# Patient Record
Sex: Male | Born: 1950 | Race: White | Hispanic: No | Marital: Married | Smoking: Former smoker
Health system: Southern US, Community
[De-identification: ages and names within clinical notes are randomized; demographics above are authoritative.]

## PROBLEM LIST (undated history)

## (undated) DIAGNOSIS — I10 Essential (primary) hypertension: Secondary | ICD-10-CM

## (undated) DIAGNOSIS — I723 Aneurysm of iliac artery: Secondary | ICD-10-CM

## (undated) DIAGNOSIS — M1990 Unspecified osteoarthritis, unspecified site: Secondary | ICD-10-CM

## (undated) DIAGNOSIS — I509 Heart failure, unspecified: Secondary | ICD-10-CM

## (undated) DIAGNOSIS — I2510 Atherosclerotic heart disease of native coronary artery without angina pectoris: Secondary | ICD-10-CM

## (undated) DIAGNOSIS — Z95810 Presence of automatic (implantable) cardiac defibrillator: Secondary | ICD-10-CM

## (undated) DIAGNOSIS — E785 Hyperlipidemia, unspecified: Secondary | ICD-10-CM

## (undated) DIAGNOSIS — E669 Obesity, unspecified: Secondary | ICD-10-CM

## (undated) DIAGNOSIS — I219 Acute myocardial infarction, unspecified: Secondary | ICD-10-CM

## (undated) DIAGNOSIS — M869 Osteomyelitis, unspecified: Secondary | ICD-10-CM

## (undated) DIAGNOSIS — I724 Aneurysm of artery of lower extremity: Secondary | ICD-10-CM

## (undated) HISTORY — DX: Aneurysm of artery of lower extremity: I72.4

## (undated) HISTORY — PX: TONSILLECTOMY: SUR1361

## (undated) HISTORY — DX: Hyperlipidemia, unspecified: E78.5

## (undated) HISTORY — PX: ANKLE FUSION: SHX881

## (undated) HISTORY — DX: Essential (primary) hypertension: I10

## (undated) HISTORY — DX: Osteomyelitis, unspecified: M86.9

## (undated) HISTORY — PX: FRACTURE SURGERY: SHX138

## (undated) HISTORY — DX: Obesity, unspecified: E66.9

## (undated) HISTORY — PX: ANKLE FRACTURE SURGERY: SHX122

## (undated) HISTORY — DX: Atherosclerotic heart disease of native coronary artery without angina pectoris: I25.10

## (undated) HISTORY — DX: Aneurysm of iliac artery: I72.3

---

## 1971-04-01 HISTORY — PX: FEMUR FRACTURE SURGERY: SHX633

## 1971-04-01 HISTORY — DX: Rider (driver) (passenger) of other motorcycle injured in unspecified traffic accident, initial encounter: V29.99XA

## 2004-11-25 ENCOUNTER — Encounter (INDEPENDENT_AMBULATORY_CARE_PROVIDER_SITE_OTHER): Payer: Self-pay | Admitting: *Deleted

## 2004-11-25 ENCOUNTER — Ambulatory Visit (HOSPITAL_BASED_OUTPATIENT_CLINIC_OR_DEPARTMENT_OTHER): Admission: RE | Admit: 2004-11-25 | Discharge: 2004-11-25 | Payer: Self-pay | Admitting: Otolaryngology

## 2004-11-25 ENCOUNTER — Ambulatory Visit (HOSPITAL_COMMUNITY): Admission: RE | Admit: 2004-11-25 | Discharge: 2004-11-25 | Payer: Self-pay | Admitting: Otolaryngology

## 2008-01-18 HISTORY — PX: CARDIAC CATHETERIZATION: SHX172

## 2008-01-21 ENCOUNTER — Ambulatory Visit: Payer: Self-pay | Admitting: Cardiothoracic Surgery

## 2008-02-28 ENCOUNTER — Ambulatory Visit: Payer: Self-pay | Admitting: Vascular Surgery

## 2008-02-28 ENCOUNTER — Encounter: Payer: Self-pay | Admitting: Cardiothoracic Surgery

## 2008-02-28 ENCOUNTER — Ambulatory Visit (HOSPITAL_COMMUNITY): Admission: RE | Admit: 2008-02-28 | Discharge: 2008-02-28 | Payer: Self-pay | Admitting: Cardiothoracic Surgery

## 2008-03-01 ENCOUNTER — Ambulatory Visit: Payer: Self-pay | Admitting: Cardiothoracic Surgery

## 2008-03-01 ENCOUNTER — Inpatient Hospital Stay (HOSPITAL_COMMUNITY): Admission: RE | Admit: 2008-03-01 | Discharge: 2008-03-05 | Payer: Self-pay | Admitting: Cardiothoracic Surgery

## 2008-03-01 HISTORY — PX: CORONARY ARTERY BYPASS GRAFT: SHX141

## 2008-03-20 ENCOUNTER — Encounter: Admission: RE | Admit: 2008-03-20 | Discharge: 2008-03-20 | Payer: Self-pay | Admitting: Cardiothoracic Surgery

## 2008-03-20 ENCOUNTER — Ambulatory Visit: Payer: Self-pay | Admitting: Cardiothoracic Surgery

## 2008-03-21 ENCOUNTER — Encounter (HOSPITAL_COMMUNITY): Admission: RE | Admit: 2008-03-21 | Discharge: 2008-03-29 | Payer: Self-pay | Admitting: Cardiology

## 2008-03-31 ENCOUNTER — Encounter (HOSPITAL_COMMUNITY): Admission: RE | Admit: 2008-03-31 | Discharge: 2008-05-29 | Payer: Self-pay | Admitting: Cardiology

## 2008-10-04 HISTORY — PX: OTHER SURGICAL HISTORY: SHX169

## 2009-07-13 ENCOUNTER — Encounter: Admission: RE | Admit: 2009-07-13 | Discharge: 2009-07-13 | Payer: Self-pay | Admitting: Orthopedic Surgery

## 2009-08-29 ENCOUNTER — Encounter: Admission: RE | Admit: 2009-08-29 | Discharge: 2009-08-29 | Payer: Self-pay | Admitting: Orthopedic Surgery

## 2009-11-23 IMAGING — CR DG CHEST 2V
2 series · 2 of 2 positions shown · non-contrast
Comparison: None

CLINICAL DATA: Chest pain and short of breath.

CHEST - 2 VIEW

[view not recorded (1 of 2)]
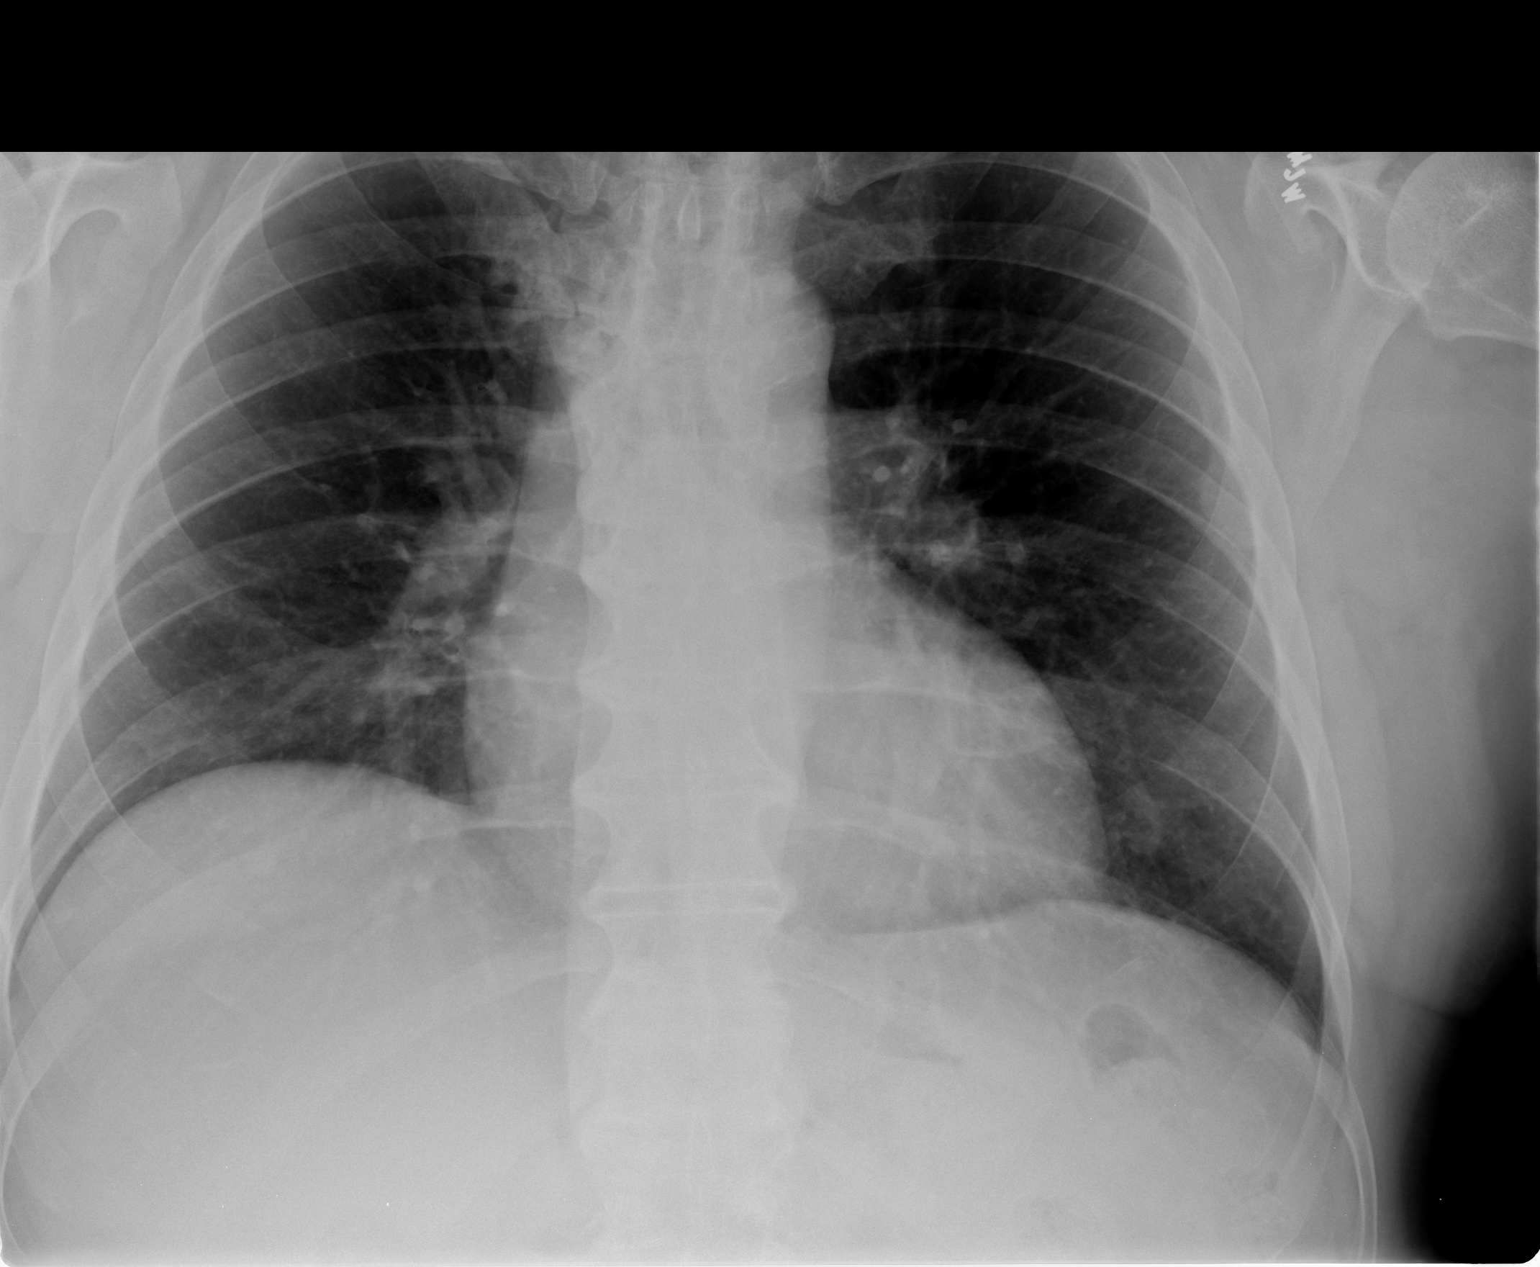

[view not recorded (2 of 2)]
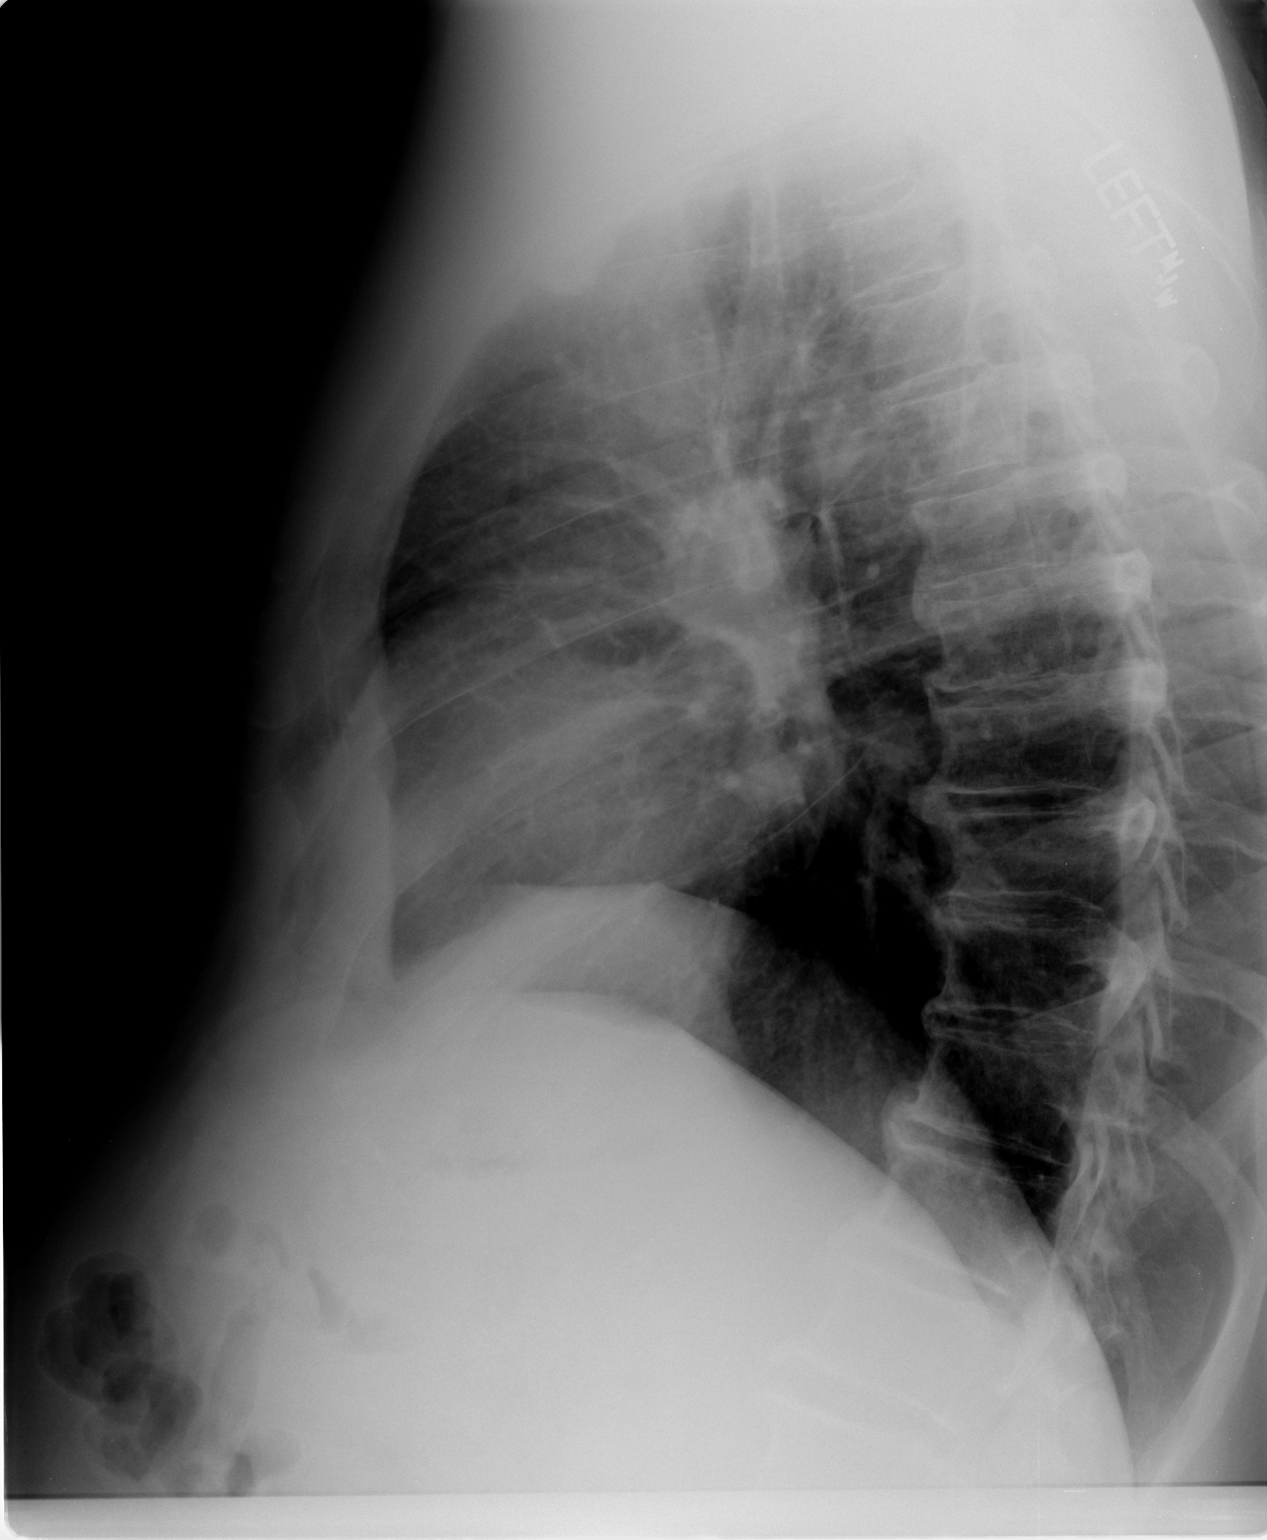

[2 of 2 positions shown; findings below may reference images not displayed]

FINDINGS: The heart is upper normal in size and there is no heart
failure.  The lungs are clear without infiltrate or effusion.
Thoracic disc degeneration and osteophytes are noted.
IMPRESSION: No active cardiopulmonary disease.

## 2009-11-25 IMAGING — CR DG CHEST 1V PORT
1 series · 1 of 1 positions shown · non-contrast
Comparison: 02/28/2008

CLINICAL DATA: CABG procedure.

PORTABLE CHEST - 1 VIEW

[AP]
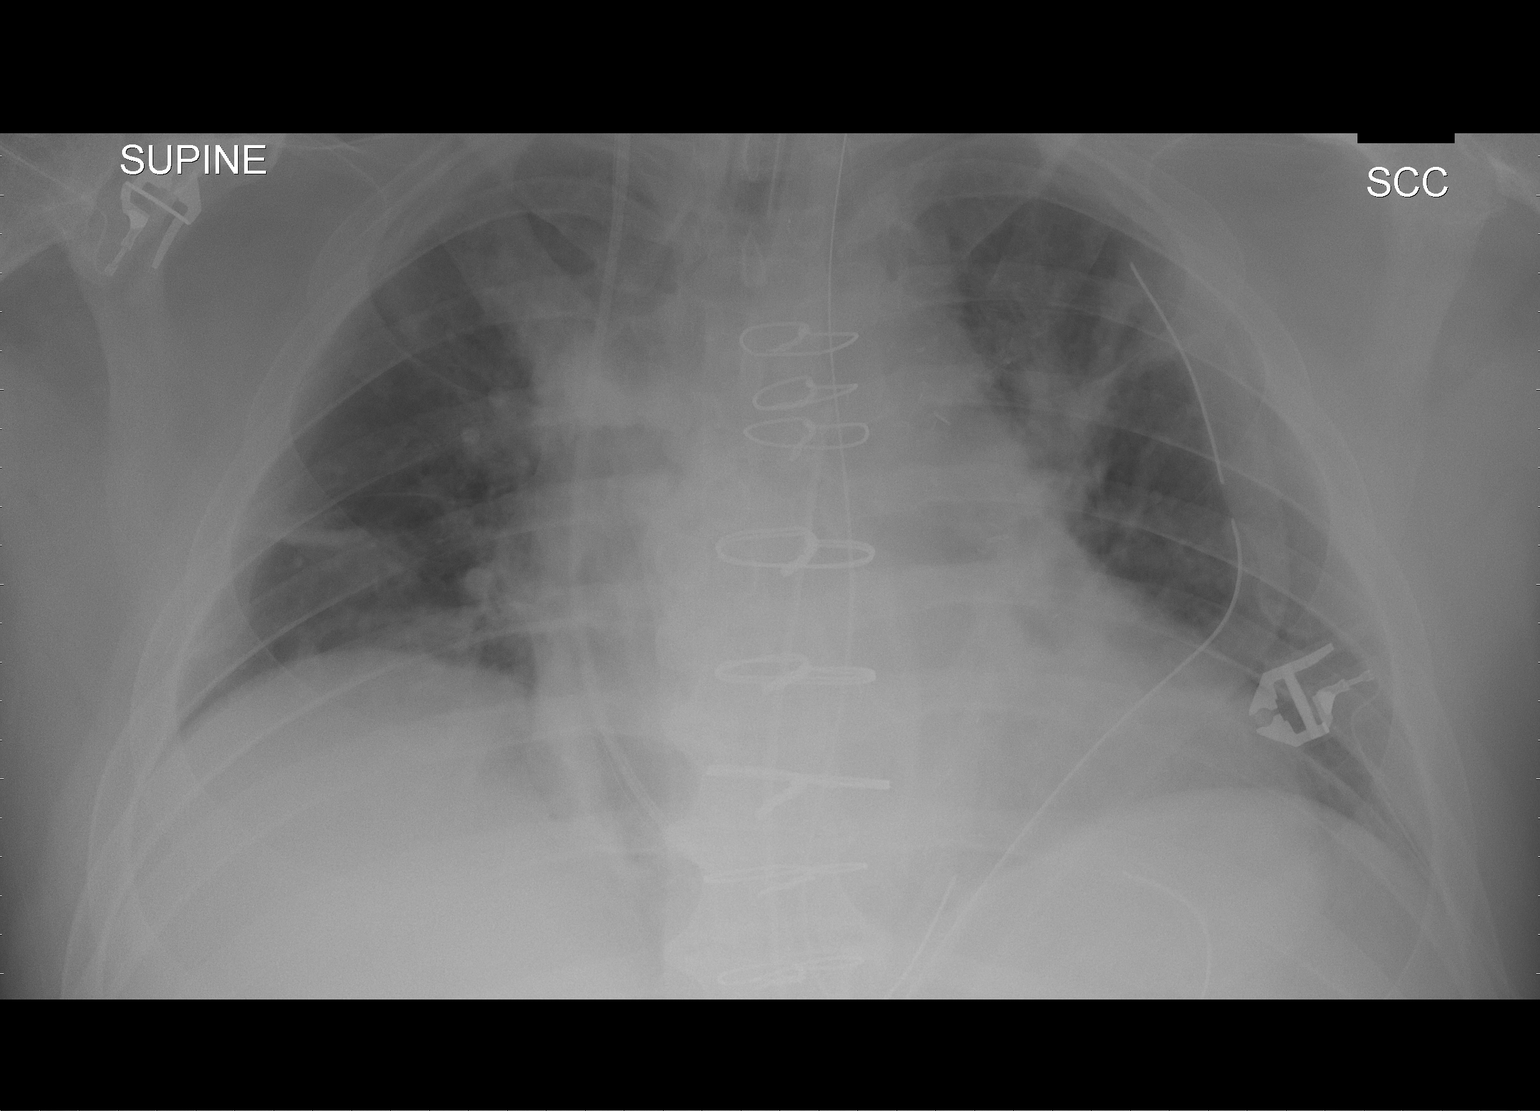

[1 of 1 positions shown; findings below may reference images not displayed]

FINDINGS: Portable view of the chest demonstrates an endotracheal
tube that is 5.6 cm above the carina. There is a nasogastric tube
along with left chest and mediastinal drains.  The patient has low
lung volumes with vascular crowding and atelectasis.  The tip of
the pulmonary arterial catheter is difficult to visualize.  No
evidence for a large pneumothorax.
IMPRESSION: Expected postoperative changes with low lung volumes.  Negative for
a large pneumothorax.

Pulmonary artery catheter tip is difficult to visualize.

## 2009-11-26 IMAGING — CR DG CHEST 1V PORT
1 series · 1 of 1 positions shown · non-contrast
Comparison: Yesterday's exam

CLINICAL DATA: CABG.  Follow-up exam.

PORTABLE CHEST - 1 VIEW

[view not recorded]
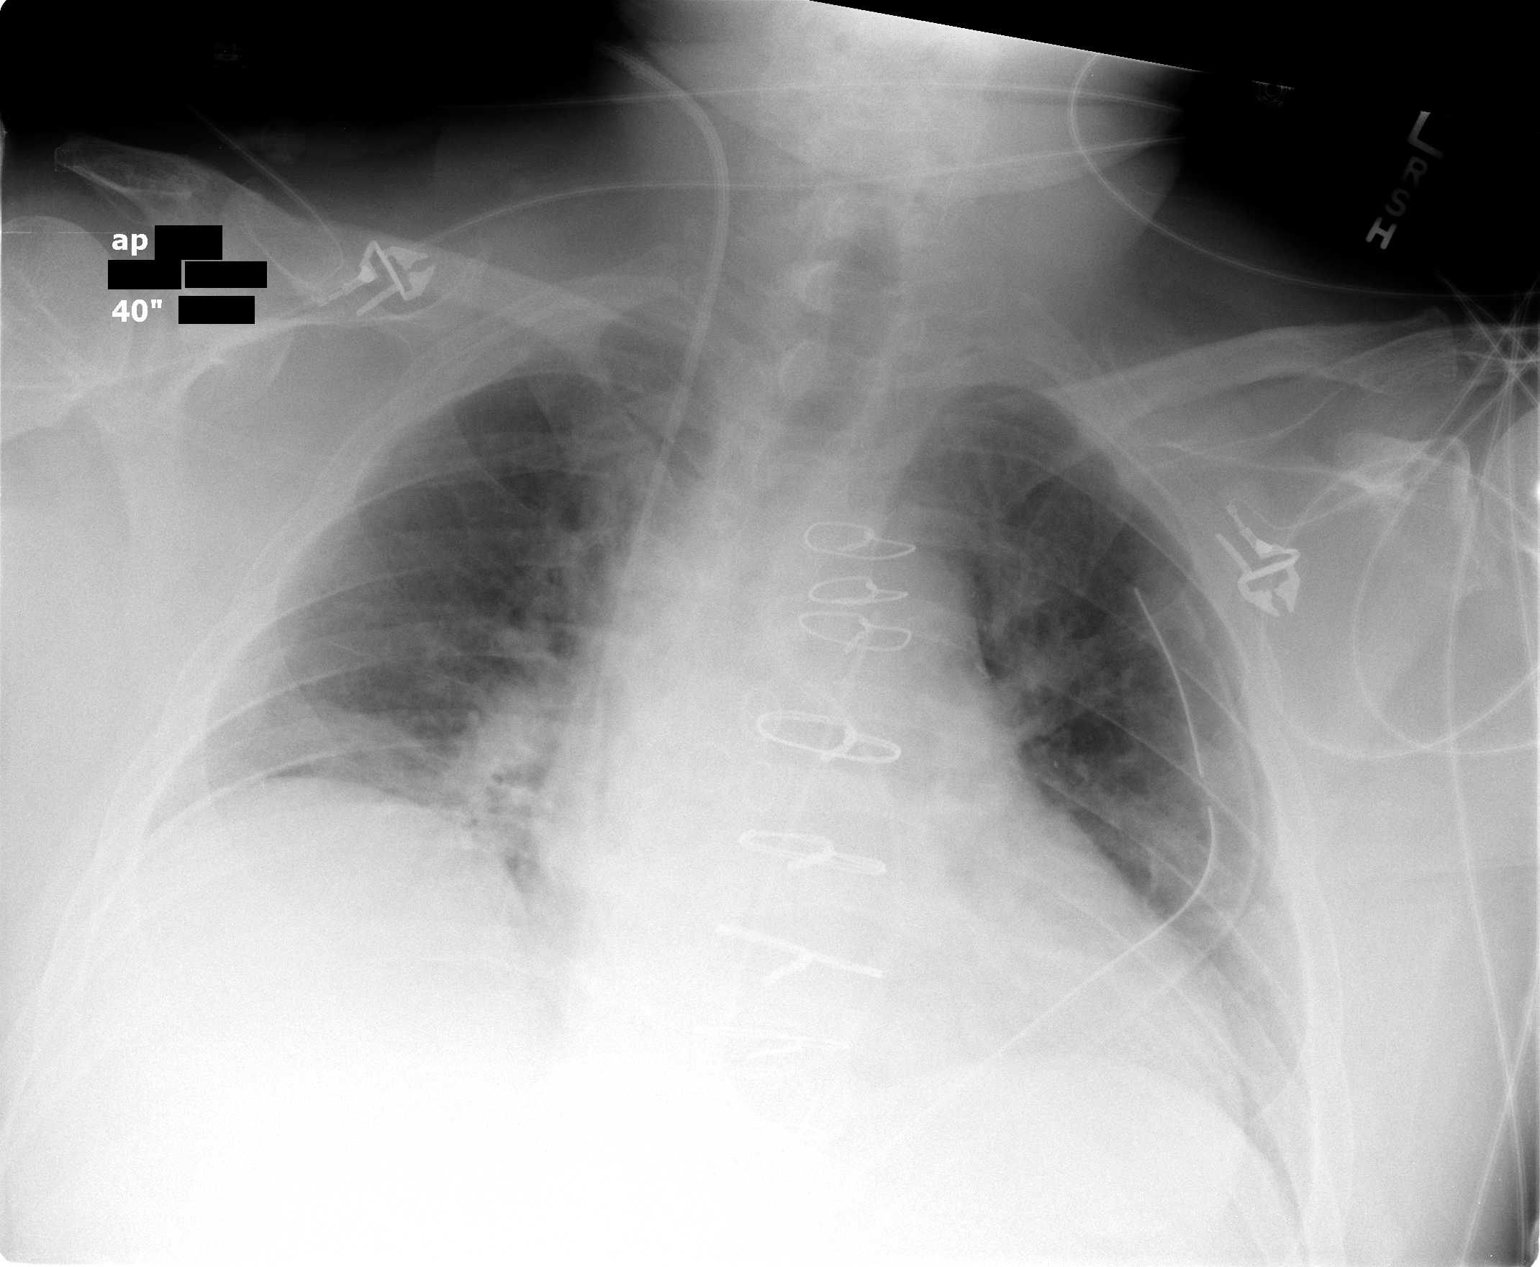

[1 of 1 positions shown; findings below may reference images not displayed]

FINDINGS: The ET and NG tubes have been removed.  The SGC is in the
main pulmonary artery segment region.  Mediastinal and left pleural
chest tubes in position.  No pneumothorax.  Subsegmental
atelectasis at the right base.  Decrease in subsegmental
atelectasis on the left.
IMPRESSION: Satisfactory post CABG chest radiographs.  Subsegmental atelectasis
at the right base.

## 2009-11-28 IMAGING — CR DG CHEST 2V
2 series · 2 of 2 positions shown · non-contrast
Comparison: 03/03/2008.

CLINICAL DATA: The post CABG.

CHEST - 2 VIEW

[w chest pa]
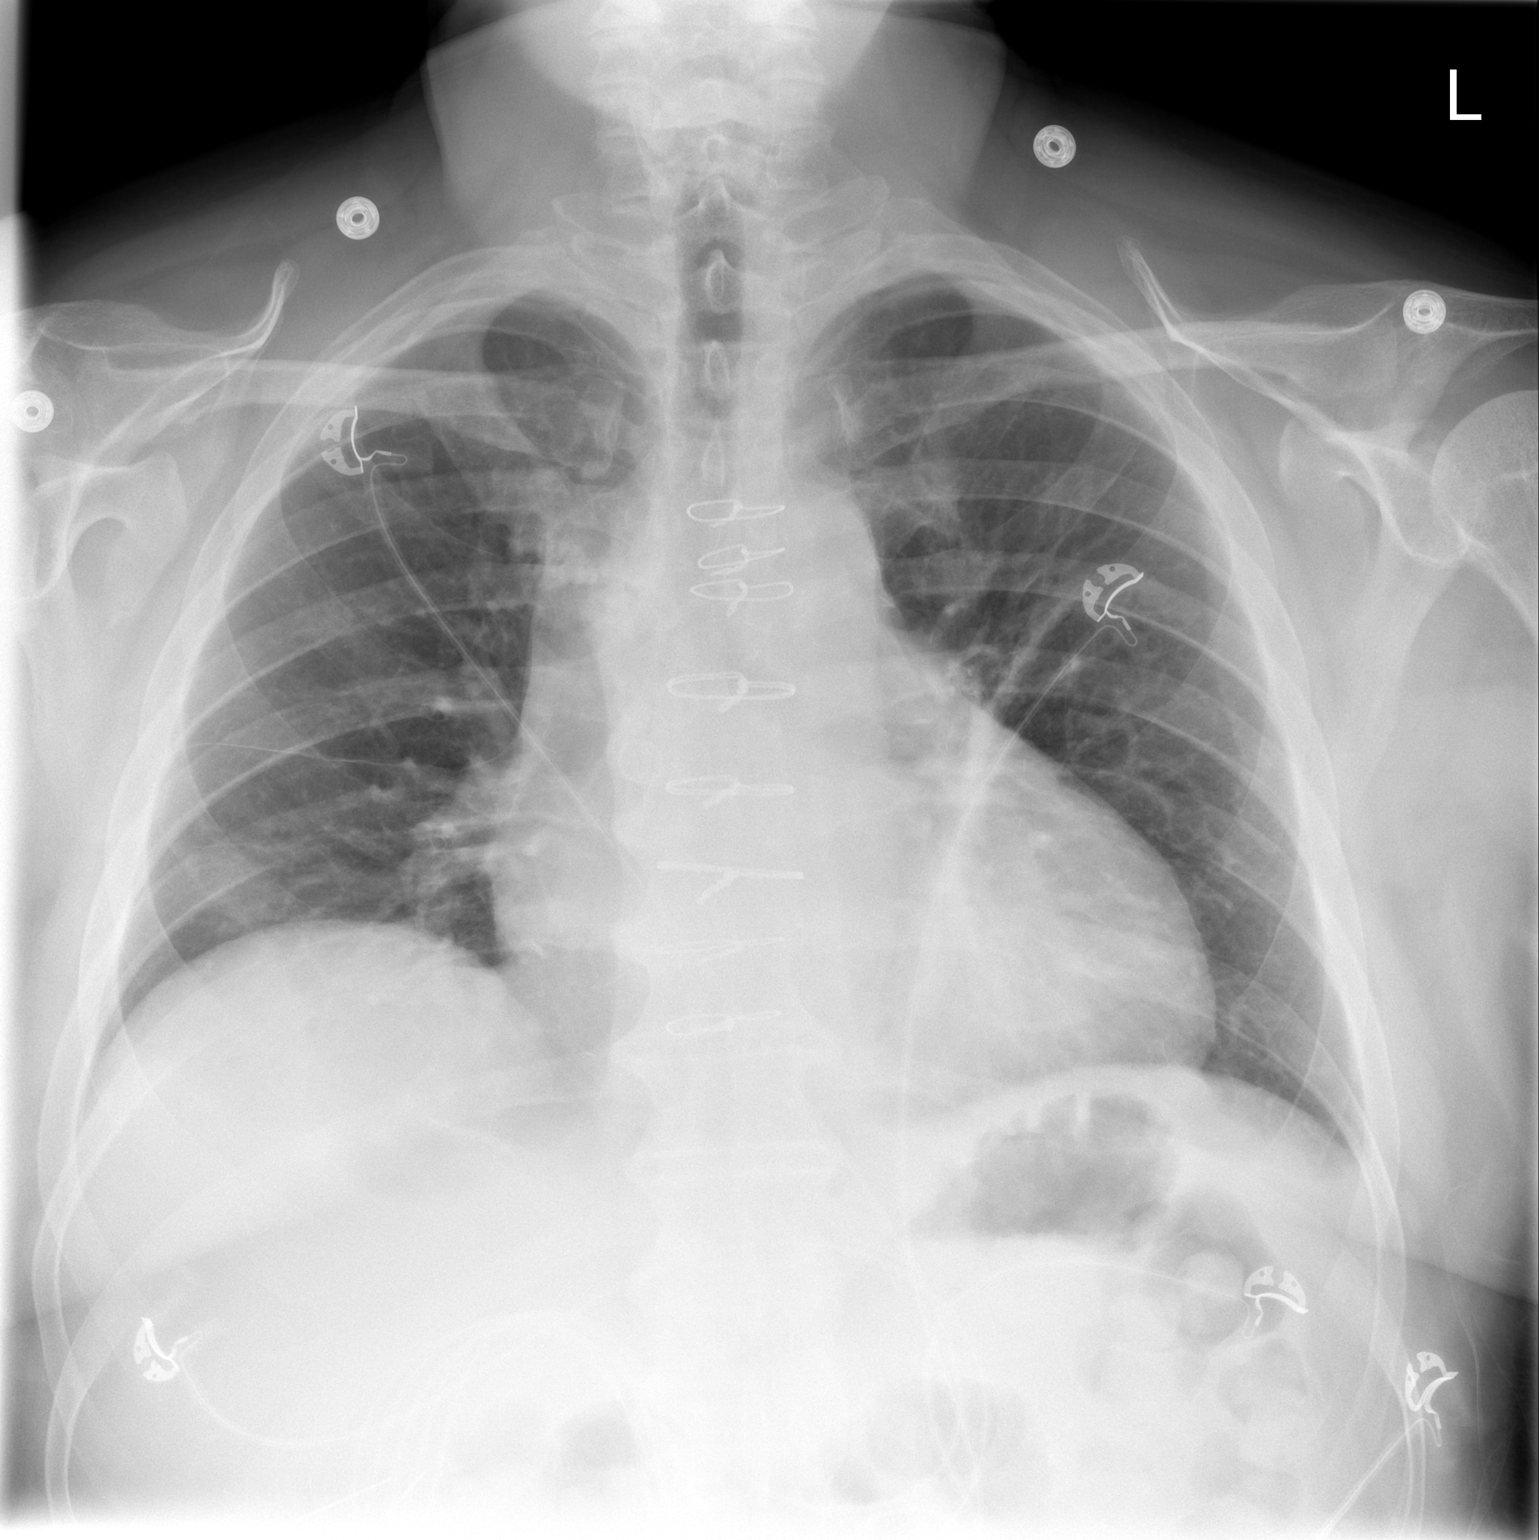

[w chest lat]
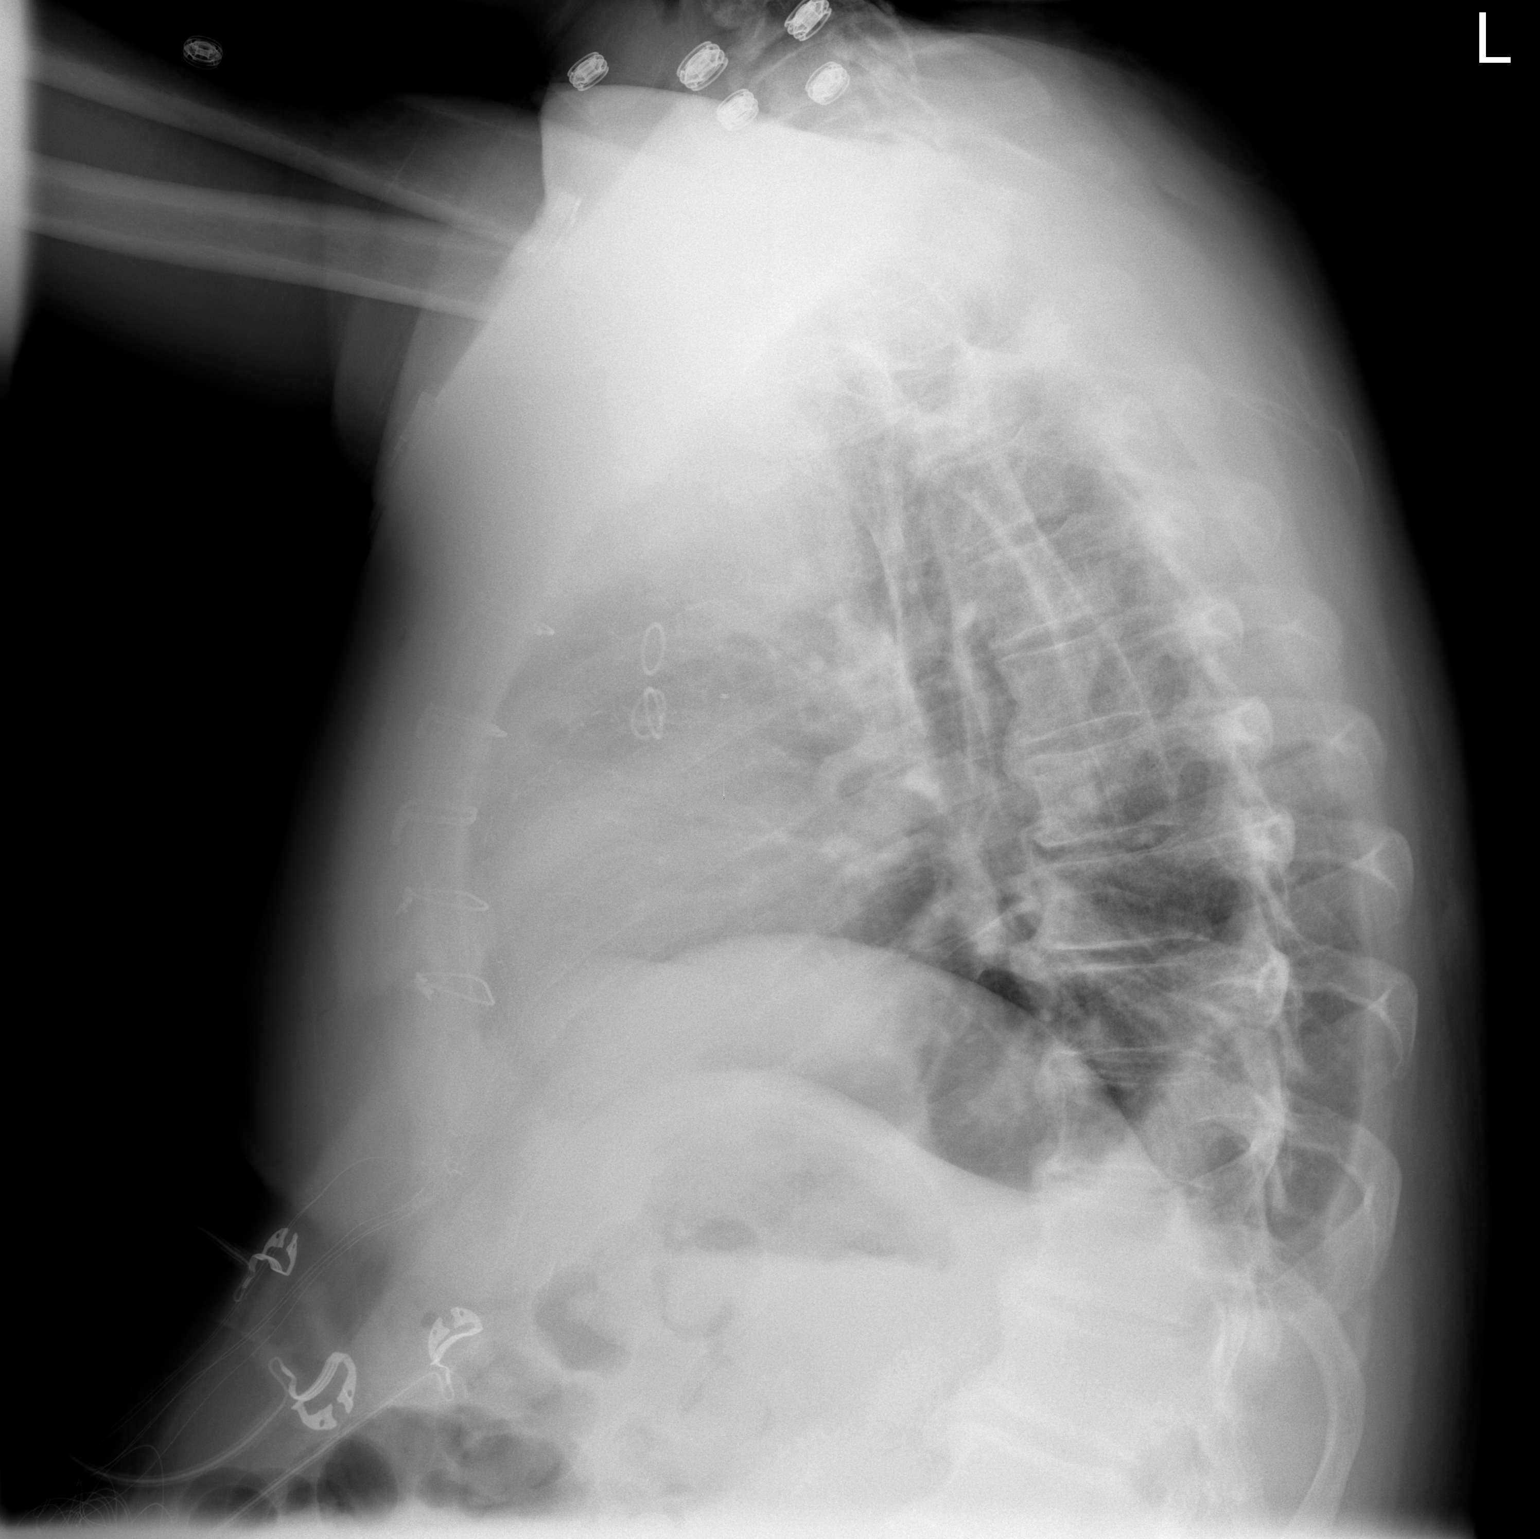

[2 of 2 positions shown; findings below may reference images not displayed]

FINDINGS: Lung volumes are low. The cardiopericardial silhouette is
enlarged. Persistent subsegmental atelectasis or scarring in the
left perihilar region.  Small bilateral pleural effusions are
noted.  The patient is status post CABG. Right IJ sheath seen
previously has been removed in the interval.  Telemetry leads
overlie the chest.
IMPRESSION: Low lung volumes with cardiomegaly and small bilateral pleural
effusions.

## 2010-02-28 HISTORY — PX: OTHER SURGICAL HISTORY: SHX169
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
## 2010-08-13 NOTE — Assessment & Plan Note (Signed)
OFFICE VISIT   LAINE, GIOVANETTI [facility]  DOB:  December 25, 1950                                        March 20, 2008  CHART #:  [id number]   HISTORY:  The patient is a 60 year old gentleman, now seen in office  visit following his coronary artery bypass grafting x4 done on  03/01/2008, per Dr. Kathlee Nations Trigt.  This was done for class IV  progressive angina with severe 3-vessel coronary artery disease and redo  of his left ventricular function.  His preoperative echocardiogram  showed a 30% ejection fraction with anterior apical and inferior wall  scarring.  He did well postsurgery and he is seen on today's date for  routine office followup.  Currently, he reports that overall he is doing  well.  He has been going to the gym for short walks on the treadmill as  well as the stationary bike.  He is not lifting any heavy weights or  exerting himself too much.  He does plan to participate in the phase 2  cardiac rehabilitation program.  He does have occasional sternal  discomfort.  He denies significant shortness of breath.  He denies  fevers, chills, or other constitutional symptoms.   CHEST X-RAY:  Chest x-ray was obtained on today's date.  It reveals tiny  left pleural effusion, stable mild cardiomegaly, and no other  acute  findings.   PHYSICAL EXAMINATION:  VITAL SIGNS:  Blood pressure 103/66, pulse 67,  respirations 18, and oxygen saturation is 96% on room air.  GENERAL:  A well-developed, adult white male in no acute distress.  PULMONARY:  Clear lungs throughout.  CARDIAC:  Regular rate and rhythm.  No murmurs, gallops, or rubs.  ABDOMEN:  Soft and nontender.  Incision is healing well without evidence  of infection.  EXTREMITIES:  No edema.   ASSESSMENT:  The patient is making excellent ongoing progress following  his surgical revascularization.  He is encouraged to continue his  rehabilitation with the formal cardiac rehabilitation phase 2 program.  I  have instructed him to continue to limit his lifting for the next  2 months.  He can resume driving at this time.  He is to continue to  follow up with his  cardiologist as they recommend for ongoing cardiac management.  We will  see him on a p.r.n. basis for any further surgical issues as they  present or at any time as requested.   [name], P.A.-C.   Sherryll Burger  D:  03/20/2008  T:  03/20/2008  Job:  [id number]

## 2010-08-13 NOTE — Op Note (Signed)
NAME:  Darrell Farrell, Darrell Farrell NO.:  [id number]   MEDICAL RECORD NO.:  [id number]          PATIENT TYPE:  INP   LOCATION:  2315                         FACILITY:  [facility]   PHYSICIAN:  Kerin Perna, M.D.  DATE OF BIRTH:  1950/11/15   DATE OF PROCEDURE:  03/01/2008  DATE OF DISCHARGE:                               OPERATIVE REPORT   OPERATIONS:  1. Coronary artery bypass grafting x4 (left internal mammary artery to      left anterior descending, saphenous vein graft to diagonal,      saphenous vein graft to obtuse marginal, and saphenous vein graft      to posterior descending).  2. Endoscopic harvest of left leg greater saphenous vein.   PREOPERATIVE DIAGNOSIS:  Class IV progressive angina with severe three-  vessel coronary artery disease and reduced left ventricular function.   POSTOPERATIVE DIAGNOSIS:  Class IV progressive angina with severe three-  vessel coronary artery disease and reduced left ventricular function.   SURGEON:  Kerin Perna, MD   ASSISTANTS:  1. Sheliah Plane, MD  2. Doree Fudge, PA-C   ANESTHESIA:  General.   INDICATIONS:  The patient is a 60 year old white male who has been  evaluated for progressive dyspnea on exertion and chest discomfort.  Cardiac catheterization by Dr. Lynnea Ferrier demonstrated severe three-vessel  disease with chronic occlusion of the LAD, chronic occlusion of the  right coronary, and high-grade stenosis of the circumflex.  His EF was  30% with an anterior apical and an inferior wall scar.  There was no  evidence of valvular insufficiency on a preoperative 2-D echo.  He was  felt to be a candidate for surgical revascularization and I examined the  patient in the office as a preoperative consult.  I discussed the  indications and expected benefits of coronary bypass surgery for  treatment of his coronary artery disease.  I reviewed the alternatives  to surgical therapy.  I discussed with the patient and his  wife the  major issues of surgery including the location of the surgical  incisions, use of general anesthesia, and cardiopulmonary bypass, and  the expected postoperative recovery.  I reviewed with the patient the  risks to him of this operation including risks of MI, stroke, bleeding,  infection, and death.  After reviewing these issues, he demonstrated his  understanding and agreed to proceed with the operation as planned under  what I felt was an informed consent.   OPERATIVE FINDINGS:  1. Old LV apical and inferior wall scars with thinning of the      myocardium.  2. Adequate targets for grafting.  3. Good conduit using the left internal mammary artery and left leg      greater saphenous vein.   PROCEDURE:  The patient was brought to operating room and placed supine  on the operating table where general anesthesia was induced.  The chest,  abdomen, and legs were prepped with Betadine and draped as a sterile  field.  A 2-D echo probe was placed by the anesthesiologist.  A sternal  incision was made and the saphenous  vein was harvested endoscopically  from the left leg.  The left internal mammary artery was harvested as a  pedicle graft from its origin at the subclavian vessels.  It was a good  vessel with excellent flow.  The sternal retractor was placed using the  deep blades due to the patient's obese body habitus.  The pericardium  was opened and suspended.  Pursestrings were placed in the ascending  aorta and right atrium.  After the vein had been harvested and inspected  and found to be adequate, the patient was given full systemic heparin  and was cannulated through pursestrings into the ascending aorta and  right atrium.  The patient was then placed on bypass and the coronaries  were identified for grafting.  The mammary artery and vein grafts were  prepared for the distal anastomoses.  Cardioplegia catheters were placed  both antegrade aortic and retrograde coronary sinus  cardioplegia.  The  patient was cooled to 32 degrees and the aortic cross-clamp was applied.  800 mL of cold blood cardioplegia was delivered in split doses between  the antegrade aortic and retrograde coronary sinus catheters.  There was  good cardioplegic arrest and septal temperature dropped less than 12  degrees.  Cardioplegia was then delivered every 20 minutes or less while  the cross-clamp was applied.   The distal coronary anastomoses were then performed.  The first distal  anastomosis was to the posterior descending branch of right coronary.  It was totally occluded.  A reverse saphenous vein was sewn end-to-side  with running 7-0 Prolene with good flow through the graft.  The second  distal anastomosis was to the obtuse marginal branch of the circumflex.  It had a proximal 80% stenosis.  A reverse saphenous vein was sewn end-  to-side with running 7-0 Prolene with good flow through the graft.  The  third distal anastomosis was to the diagonal branch of LAD.  This was  1.5 mm vessel with proximal total occlusion.  Reverse saphenous vein was  sewn end-to-side with running 7-0 Prolene with good flow through the  graft.  Cardioplegia was redosed.  The fourth distal anastomosis was to  the distal third of the LAD.  This was a 1.5 mm vessel with heavy  plaque.  It was proximally occluded.  The left IMA pedicle was brought  through an opening created in the left lateral pericardium and was  brought down onto the LAD and sewn end-to-side with running 8-0 Prolene.  There was good flow through the anastomosis after briefly releasing the  pedicle bulldog on the mammary artery.  The bulldog was reapplied and  the pedicle was secured to the epicardium.   While the cross-clamp was still in place, 3 proximal vein anastomoses  were performed on the ascending aorta using a 4.0 mm punch with running  7-0 Prolene.  Prior to tying down the final proximal anastomosis, the  air was vented from the  coronaries with a dose of retrograde warm blood  cardioplegia.  The final proximal anastomosis was tied and the cross-  clamp was removed.   The heart resumed a spontaneous rhythm.  Air was aspirated from the vein  grafts with a 27-gauge needle and each graft was opened and had good  flow.  The cardioplegia catheters were removed.  Proximal and distal  anastomoses were checked and found to be hemostatic.  The patient was  rewarmed to 37 degrees.  Temporary pacing wires were applied.  Due to  the  patient's poor LV function, a left ventricular epicardial lead was  placed for biventricular pacing.  The lungs were then re-expanded and  the ventilator was resumed.  The patient was weaned from bypass on low-  dose dopamine.  Cardiac output and blood pressure were stable.  The  transesophageal echo showed improved global LV function.  Protamine was  administered without adverse reaction.  The cannulas were removed.  The  mediastinum was irrigated with warm antibiotic irrigation.  Leg incision  was irrigated and closed in a standard fashion.  The superior  pericardial fat was closed over the aorta.  Two mediastinal and a left  pleural chest tube were placed and  brought out through separate incisions.  The sternum was closed with  interrupted steel wire.  The pectoralis fascia was closed with a running  #1 Vicryl.  Subcutaneous and skin layers were closed with running Vicryl  and sterile dressings were applied.  Total bypass time was 124 minutes.      Kerin Perna, M.D.  Electronically Signed     PV/MEDQ  D:  03/01/2008  T:  03/02/2008  Job:  [id number]   cc:   Ritta Slot, MD
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
## 2010-08-13 NOTE — Op Note (Signed)
NAME:  Darrell Farrell, Darrell Farrell NO.:  [id number]   MEDICAL RECORD NO.:  [id number]          PATIENT TYPE:  INP   LOCATION:  2315                         FACILITY:  [facility]   PHYSICIAN:  Guadalupe Maple, M.D.  DATE OF BIRTH:  1950/10/31   DATE OF PROCEDURE:  03/01/2008  DATE OF DISCHARGE:                               OPERATIVE REPORT   PROCEDURE:  Intraoperative transesophageal echocardiography.   [name] is a 60 year old white male with a history of coronary  artery disease and left ventricular dysfunction who is scheduled to  undergo coronary artery bypass grafting by Dr. Morton Peters.  Intraoperative transesophageal echocardiography was requested to  evaluate the left ventricular function and to determine if any valvular  pathology was present and serve as a monitor for intraoperative volume  status.   The patient was brought to the operating room at [facility] and  general anesthesia was induced without difficulty.  The trachea was  intubated without difficulty.  The transesophageal echocardiography  probe was inserted into the esophagus without difficulty.   IMPRESSION:   PREBYPASS FINDINGS:  1. Aortic valve.  The aortic valve was trileaflet.  The leaflets were      thin and pliable and opened normally.  There was no aortic      insufficiency.  2. Mitral valve.  The mitral leaflets were thin and pliable, opened      normally and there was trace mitral insufficiency.  There were no      prolapse or fluttering of the leaflets.  3. Left ventricle.  There was moderate left ventricular dysfunction.      The distal anterior septum, anterior wall, and apex was akinetic      and appeared thinned, the basilar inferior wall was hypokinetic to      akinetic.  The other segments appeared to be contracting normally.      Ejection fraction was estimated at 30%.  There was no thrombus in      the left ventricular apex.  4. Right ventricle.  The right ventricular  size was normal.  There was      good contractility of the right ventricular free wall.  5. Tricuspid valve.  The tricuspid valve was structurally intact with      trace tricuspid insufficiency.  6. Interatrial septum.  The interatrial septum was intact without      evidence of patent foramen ovale or atrial septal defect.  7. Left atrium.  The left atrial size appeared to be within normal      limits.  There was no thrombus in the left atrium and left atrial      appendage.  8. Ascending aorta.  The ascending aorta appeared normal.  There was      no significant atheromatous disease noted.  There was a well-      defined aortic root in sinotubular region, no aneurysmal areas      appreciated.  9. Descending aorta.  The descending aorta showed slight atheromatous      disease and measured 2.7 cm in diameter.   POSTBYPASS FINDINGS:  1. The aortic valve.  The aortic valve was unchanged from the      prebypass study.  The leaflets opened normally and there was no      aortic insufficiency.  2. Mitral valve.  The mitral leaflets opened normally.  There was      trace mitral insufficiency.  3. Left ventricle.  The left ventricular function again showed      ejection fraction approximately at 30% with akinetic distal      anterior wall and anterior septum and apex with severely      hypokinetic to akinetic basilar inferior wall.  Other segments      appeared to contract normally and ejection fraction was again      estimated at 30-35%.           ______________________________  Guadalupe Maple, M.D.     DCJ/MEDQ  D:  03/01/2008  T:  03/02/2008  Job:  [id number]

## 2010-12-31 LAB — COMPREHENSIVE METABOLIC PANEL
ALT: 47 U/L (ref 0–53)
AST: 32 U/L (ref 0–37)
Albumin: 4 g/dL (ref 3.5–5.2)
Alkaline Phosphatase: 97 U/L (ref 39–117)
BUN: 8 mg/dL (ref 6–23)
CO2: 22 mEq/L (ref 19–32)
Calcium: 9.4 mg/dL (ref 8.4–10.5)
Chloride: 106 mEq/L (ref 96–112)
Creatinine, Ser: 0.66 mg/dL (ref 0.4–1.5)
GFR calc Af Amer: >60 mL/min (ref >60)
GFR calc non Af Amer: >60 mL/min (ref >60)
Glucose, Bld: 109 mg/dL — ABNORMAL HIGH (ref 70–99)
Potassium: 4.4 mEq/L (ref 3.5–5.1)
Sodium: 138 mEq/L (ref 135–145)
Total Bilirubin: 0.8 mg/dL (ref 0.3–1.2)
Total Protein: 6.3 g/dL (ref 6.0–8.3)

## 2010-12-31 LAB — CBC
HCT: 45.8 % (ref 39.0–52.0)
Hemoglobin: 16.4 g/dL (ref 13.0–17.0)
MCHC: 35.8 g/dL (ref 30.0–36.0)
MCV: 91.5 fL (ref 78.0–100.0)
Platelets: 189 10*3/uL (ref 150–400)
RBC: 5 MIL/uL (ref 4.22–5.81)
RDW: 12.8 % (ref 11.5–15.5)
WBC: 6.1 10*3/uL (ref 4.0–10.5)

## 2010-12-31 LAB — BLOOD GAS, ARTERIAL
Acid-base deficit: 0.1 mmol/L (ref 0.0–2.0)
Bicarbonate: 23.8 mEq/L (ref 20.0–24.0)
Drawn by: 181601
FIO2: 0.21 %
O2 Saturation: 97.9 %
Patient temperature: 98.6
TCO2: 24.9 mmol/L (ref 0–100)
pCO2 arterial: 37.1 mmHg (ref 35.0–45.0)
pH, Arterial: 7.422 (ref 7.350–7.450)
pO2, Arterial: 97.3 mmHg (ref 80.0–100.0)

## 2010-12-31 LAB — HEMOGLOBIN A1C
Hgb A1c MFr Bld: 5.5 % (ref 4.6–6.1)
Mean Plasma Glucose: 111 mg/dL

## 2010-12-31 LAB — PROTIME-INR
INR: 0.9 (ref 0.00–1.49)
Prothrombin Time: 12.5 seconds (ref 11.6–15.2)

## 2010-12-31 LAB — URINALYSIS, ROUTINE W REFLEX MICROSCOPIC
Bilirubin Urine: NEGATIVE
Glucose, UA: NEGATIVE mg/dL
Hgb urine dipstick: NEGATIVE
Ketones, ur: NEGATIVE mg/dL
Nitrite: NEGATIVE
Protein, ur: NEGATIVE mg/dL
Specific Gravity, Urine: 1.022 (ref 1.005–1.030)
Urobilinogen, UA: 1 mg/dL (ref 0.0–1.0)
pH: 6.5 (ref 5.0–8.0)

## 2010-12-31 LAB — TYPE AND SCREEN
ABO/RH(D): B POS
Antibody Screen: NEGATIVE

## 2010-12-31 LAB — APTT: aPTT: 27 seconds (ref 24–37)

## 2010-12-31 LAB — ABO/RH: ABO/RH(D): B POS

## 2011-01-03 LAB — CBC
HCT: 29.2 % — ABNORMAL LOW (ref 39.0–52.0)
HCT: 30.8 % — ABNORMAL LOW (ref 39.0–52.0)
HCT: 31.7 % — ABNORMAL LOW (ref 39.0–52.0)
HCT: 32.1 % — ABNORMAL LOW (ref 39.0–52.0)
HCT: 33.9 % — ABNORMAL LOW (ref 39.0–52.0)
HCT: 34.3 % — ABNORMAL LOW (ref 39.0–52.0)
Hemoglobin: 10.4 g/dL — ABNORMAL LOW (ref 13.0–17.0)
Hemoglobin: 10.8 g/dL — ABNORMAL LOW (ref 13.0–17.0)
Hemoglobin: 11.1 g/dL — ABNORMAL LOW (ref 13.0–17.0)
Hemoglobin: 11.3 g/dL — ABNORMAL LOW (ref 13.0–17.0)
Hemoglobin: 11.9 g/dL — ABNORMAL LOW (ref 13.0–17.0)
Hemoglobin: 12.1 g/dL — ABNORMAL LOW (ref 13.0–17.0)
MCHC: 34.8 g/dL (ref 30.0–36.0)
MCHC: 35 g/dL (ref 30.0–36.0)
MCHC: 35.1 g/dL (ref 30.0–36.0)
MCHC: 35.2 g/dL (ref 30.0–36.0)
MCHC: 35.7 g/dL (ref 30.0–36.0)
MCHC: 35.8 g/dL (ref 30.0–36.0)
MCV: 91.6 fL (ref 78.0–100.0)
MCV: 91.6 fL (ref 78.0–100.0)
MCV: 91.9 fL (ref 78.0–100.0)
MCV: 92.2 fL (ref 78.0–100.0)
MCV: 92.4 fL (ref 78.0–100.0)
MCV: 92.8 fL (ref 78.0–100.0)
Platelets: 117 10*3/uL — ABNORMAL LOW (ref 150–400)
Platelets: 122 10*3/uL — ABNORMAL LOW (ref 150–400)
Platelets: 123 10*3/uL — ABNORMAL LOW (ref 150–400)
Platelets: 126 10*3/uL — ABNORMAL LOW (ref 150–400)
Platelets: 127 10*3/uL — ABNORMAL LOW (ref 150–400)
Platelets: 130 10*3/uL — ABNORMAL LOW (ref 150–400)
RBC: 3.19 MIL/uL — ABNORMAL LOW (ref 4.22–5.81)
RBC: 3.33 MIL/uL — ABNORMAL LOW (ref 4.22–5.81)
RBC: 3.44 MIL/uL — ABNORMAL LOW (ref 4.22–5.81)
RBC: 3.46 MIL/uL — ABNORMAL LOW (ref 4.22–5.81)
RBC: 3.7 MIL/uL — ABNORMAL LOW (ref 4.22–5.81)
RBC: 3.73 MIL/uL — ABNORMAL LOW (ref 4.22–5.81)
RDW: 12.9 % (ref 11.5–15.5)
RDW: 13 % (ref 11.5–15.5)
RDW: 13.1 % (ref 11.5–15.5)
RDW: 13.2 % (ref 11.5–15.5)
RDW: 13.4 % (ref 11.5–15.5)
RDW: 13.4 % (ref 11.5–15.5)
WBC: 11.3 10*3/uL — ABNORMAL HIGH (ref 4.0–10.5)
WBC: 13.3 10*3/uL — ABNORMAL HIGH (ref 4.0–10.5)
WBC: 14.7 10*3/uL — ABNORMAL HIGH (ref 4.0–10.5)
WBC: 8.3 10*3/uL (ref 4.0–10.5)
WBC: 8.8 10*3/uL (ref 4.0–10.5)
WBC: 9.6 10*3/uL (ref 4.0–10.5)

## 2011-01-03 LAB — BASIC METABOLIC PANEL
BUN: 10 mg/dL (ref 6–23)
BUN: 10 mg/dL (ref 6–23)
BUN: 6 mg/dL (ref 6–23)
CO2: 24 mEq/L (ref 19–32)
CO2: 27 mEq/L (ref 19–32)
CO2: 31 mEq/L (ref 19–32)
Calcium: 7.7 mg/dL — ABNORMAL LOW (ref 8.4–10.5)
Calcium: 8.2 mg/dL — ABNORMAL LOW (ref 8.4–10.5)
Calcium: 8.6 mg/dL (ref 8.4–10.5)
Chloride: 100 mEq/L (ref 96–112)
Chloride: 107 mEq/L (ref 96–112)
Chloride: 99 mEq/L (ref 96–112)
Creatinine, Ser: 0.76 mg/dL (ref 0.4–1.5)
Creatinine, Ser: 0.77 mg/dL (ref 0.4–1.5)
Creatinine, Ser: 0.9 mg/dL (ref 0.4–1.5)
GFR calc Af Amer: >60 mL/min (ref >60)
GFR calc Af Amer: >60 mL/min (ref >60)
GFR calc Af Amer: >60 mL/min (ref >60)
GFR calc non Af Amer: >60 mL/min (ref >60)
GFR calc non Af Amer: >60 mL/min (ref >60)
GFR calc non Af Amer: >60 mL/min (ref >60)
Glucose, Bld: 113 mg/dL — ABNORMAL HIGH (ref 70–99)
Glucose, Bld: 127 mg/dL — ABNORMAL HIGH (ref 70–99)
Glucose, Bld: 131 mg/dL — ABNORMAL HIGH (ref 70–99)
Potassium: 3.5 mEq/L (ref 3.5–5.1)
Potassium: 3.9 mEq/L (ref 3.5–5.1)
Potassium: 4 mEq/L (ref 3.5–5.1)
Sodium: 133 mEq/L — ABNORMAL LOW (ref 135–145)
Sodium: 134 mEq/L — ABNORMAL LOW (ref 135–145)
Sodium: 136 mEq/L (ref 135–145)

## 2011-01-03 LAB — CREATININE, SERUM
Creatinine, Ser: 0.77 mg/dL (ref 0.4–1.5)
Creatinine, Ser: 0.81 mg/dL (ref 0.4–1.5)
GFR calc Af Amer: >60 mL/min (ref >60)
GFR calc Af Amer: >60 mL/min (ref >60)
GFR calc non Af Amer: >60 mL/min (ref >60)
GFR calc non Af Amer: >60 mL/min (ref >60)

## 2011-01-03 LAB — GLUCOSE, CAPILLARY
Glucose-Capillary: 101 mg/dL — ABNORMAL HIGH (ref 70–99)
Glucose-Capillary: 103 mg/dL — ABNORMAL HIGH (ref 70–99)
Glucose-Capillary: 107 mg/dL — ABNORMAL HIGH (ref 70–99)
Glucose-Capillary: 109 mg/dL — ABNORMAL HIGH (ref 70–99)
Glucose-Capillary: 111 mg/dL — ABNORMAL HIGH (ref 70–99)
Glucose-Capillary: 112 mg/dL — ABNORMAL HIGH (ref 70–99)
Glucose-Capillary: 112 mg/dL — ABNORMAL HIGH (ref 70–99)
Glucose-Capillary: 117 mg/dL — ABNORMAL HIGH (ref 70–99)
Glucose-Capillary: 119 mg/dL — ABNORMAL HIGH (ref 70–99)
Glucose-Capillary: 119 mg/dL — ABNORMAL HIGH (ref 70–99)
Glucose-Capillary: 119 mg/dL — ABNORMAL HIGH (ref 70–99)
Glucose-Capillary: 120 mg/dL — ABNORMAL HIGH (ref 70–99)
Glucose-Capillary: 121 mg/dL — ABNORMAL HIGH (ref 70–99)
Glucose-Capillary: 123 mg/dL — ABNORMAL HIGH (ref 70–99)
Glucose-Capillary: 125 mg/dL — ABNORMAL HIGH (ref 70–99)
Glucose-Capillary: 126 mg/dL — ABNORMAL HIGH (ref 70–99)
Glucose-Capillary: 128 mg/dL — ABNORMAL HIGH (ref 70–99)
Glucose-Capillary: 131 mg/dL — ABNORMAL HIGH (ref 70–99)
Glucose-Capillary: 131 mg/dL — ABNORMAL HIGH (ref 70–99)
Glucose-Capillary: 135 mg/dL — ABNORMAL HIGH (ref 70–99)
Glucose-Capillary: 136 mg/dL — ABNORMAL HIGH (ref 70–99)
Glucose-Capillary: 141 mg/dL — ABNORMAL HIGH (ref 70–99)
Glucose-Capillary: 145 mg/dL — ABNORMAL HIGH (ref 70–99)
Glucose-Capillary: 146 mg/dL — ABNORMAL HIGH (ref 70–99)
Glucose-Capillary: 89 mg/dL (ref 70–99)

## 2011-01-03 LAB — POCT I-STAT 3, ART BLOOD GAS (G3+)
Acid-Base Excess: 2 mmol/L (ref 0.0–2.0)
Acid-base deficit: 1 mmol/L (ref 0.0–2.0)
Acid-base deficit: 1 mmol/L (ref 0.0–2.0)
Acid-base deficit: 1 mmol/L (ref 0.0–2.0)
Bicarbonate: 23.4 mEq/L (ref 20.0–24.0)
Bicarbonate: 23.7 mEq/L (ref 20.0–24.0)
Bicarbonate: 25.1 mEq/L — ABNORMAL HIGH (ref 20.0–24.0)
Bicarbonate: 25.9 mEq/L — ABNORMAL HIGH (ref 20.0–24.0)
Bicarbonate: 28.1 mEq/L — ABNORMAL HIGH (ref 20.0–24.0)
O2 Saturation: 100 %
O2 Saturation: 90 %
O2 Saturation: 96 %
O2 Saturation: 97 %
O2 Saturation: 99 %
Patient temperature: 32
Patient temperature: 37.3
Patient temperature: 38
Patient temperature: 38.2
Patient temperature: 38.5
TCO2: 25 mmol/L (ref 0–100)
TCO2: 25 mmol/L (ref 0–100)
TCO2: 27 mmol/L (ref 0–100)
TCO2: 27 mmol/L (ref 0–100)
TCO2: 30 mmol/L (ref 0–100)
pCO2 arterial: 38.5 mmHg (ref 35.0–45.0)
pCO2 arterial: 40.8 mmHg (ref 35.0–45.0)
pCO2 arterial: 41.4 mmHg (ref 35.0–45.0)
pCO2 arterial: 45 mmHg (ref 35.0–45.0)
pCO2 arterial: 49.2 mmHg — ABNORMAL HIGH (ref 35.0–45.0)
pH, Arterial: 7.321 — ABNORMAL LOW (ref 7.350–7.450)
pH, Arterial: 7.37 (ref 7.350–7.450)
pH, Arterial: 7.371 (ref 7.350–7.450)
pH, Arterial: 7.372 (ref 7.350–7.450)
pH, Arterial: 7.449 (ref 7.350–7.450)
pO2, Arterial: 104 mmHg — ABNORMAL HIGH (ref 80.0–100.0)
pO2, Arterial: 121 mmHg — ABNORMAL HIGH (ref 80.0–100.0)
pO2, Arterial: 287 mmHg — ABNORMAL HIGH (ref 80.0–100.0)
pO2, Arterial: 67 mmHg — ABNORMAL LOW (ref 80.0–100.0)
pO2, Arterial: 87 mmHg (ref 80.0–100.0)

## 2011-01-03 LAB — POCT I-STAT 4, (NA,K, GLUC, HGB,HCT)
Glucose, Bld: 110 mg/dL — ABNORMAL HIGH (ref 70–99)
Glucose, Bld: 116 mg/dL — ABNORMAL HIGH (ref 70–99)
Glucose, Bld: 129 mg/dL — ABNORMAL HIGH (ref 70–99)
Glucose, Bld: 130 mg/dL — ABNORMAL HIGH (ref 70–99)
Glucose, Bld: 135 mg/dL — ABNORMAL HIGH (ref 70–99)
Glucose, Bld: 180 mg/dL — ABNORMAL HIGH (ref 70–99)
HCT: 28 % — ABNORMAL LOW (ref 39.0–52.0)
HCT: 31 % — ABNORMAL LOW (ref 39.0–52.0)
HCT: 31 % — ABNORMAL LOW (ref 39.0–52.0)
HCT: 37 % — ABNORMAL LOW (ref 39.0–52.0)
HCT: 42 % (ref 39.0–52.0)
HCT: 45 % (ref 39.0–52.0)
Hemoglobin: 10.5 g/dL — ABNORMAL LOW (ref 13.0–17.0)
Hemoglobin: 10.5 g/dL — ABNORMAL LOW (ref 13.0–17.0)
Hemoglobin: 12.6 g/dL — ABNORMAL LOW (ref 13.0–17.0)
Hemoglobin: 14.3 g/dL (ref 13.0–17.0)
Hemoglobin: 15.3 g/dL (ref 13.0–17.0)
Hemoglobin: 9.5 g/dL — ABNORMAL LOW (ref 13.0–17.0)
Potassium: 3.6 mEq/L (ref 3.5–5.1)
Potassium: 4.3 mEq/L (ref 3.5–5.1)
Potassium: 4.3 mEq/L (ref 3.5–5.1)
Potassium: 5 mEq/L (ref 3.5–5.1)
Potassium: 6 mEq/L — ABNORMAL HIGH (ref 3.5–5.1)
Potassium: 6.3 mEq/L (ref 3.5–5.1)
Sodium: 132 mEq/L — ABNORMAL LOW (ref 135–145)
Sodium: 133 mEq/L — ABNORMAL LOW (ref 135–145)
Sodium: 137 mEq/L (ref 135–145)
Sodium: 138 mEq/L (ref 135–145)
Sodium: 138 mEq/L (ref 135–145)
Sodium: 141 mEq/L (ref 135–145)

## 2011-01-03 LAB — MAGNESIUM
Magnesium: 2.3 mg/dL (ref 1.5–2.5)
Magnesium: 2.5 mg/dL (ref 1.5–2.5)
Magnesium: 2.6 mg/dL — ABNORMAL HIGH (ref 1.5–2.5)

## 2011-01-03 LAB — PREPARE PLATELET PHERESIS

## 2011-01-03 LAB — POCT I-STAT, CHEM 8
BUN: 8 mg/dL (ref 6–23)
BUN: 8 mg/dL (ref 6–23)
Calcium, Ion: 1.17 mmol/L (ref 1.12–1.32)
Calcium, Ion: 1.18 mmol/L (ref 1.12–1.32)
Chloride: 101 mEq/L (ref 96–112)
Chloride: 108 mEq/L (ref 96–112)
Creatinine, Ser: 0.9 mg/dL (ref 0.4–1.5)
Creatinine, Ser: 0.9 mg/dL (ref 0.4–1.5)
Glucose, Bld: 107 mg/dL — ABNORMAL HIGH (ref 70–99)
Glucose, Bld: 121 mg/dL — ABNORMAL HIGH (ref 70–99)
HCT: 31 % — ABNORMAL LOW (ref 39.0–52.0)
HCT: 33 % — ABNORMAL LOW (ref 39.0–52.0)
Hemoglobin: 10.5 g/dL — ABNORMAL LOW (ref 13.0–17.0)
Hemoglobin: 11.2 g/dL — ABNORMAL LOW (ref 13.0–17.0)
Potassium: 3.9 mEq/L (ref 3.5–5.1)
Potassium: 4 mEq/L (ref 3.5–5.1)
Sodium: 138 mEq/L (ref 135–145)
Sodium: 141 mEq/L (ref 135–145)
TCO2: 23 mmol/L (ref 0–100)
TCO2: 26 mmol/L (ref 0–100)

## 2011-01-03 LAB — POCT I-STAT 3, VENOUS BLOOD GAS (G3P V)
Bicarbonate: 26.5 mEq/L — ABNORMAL HIGH (ref 20.0–24.0)
O2 Saturation: 80 %
Patient temperature: 32
TCO2: 28 mmol/L (ref 0–100)
pCO2, Ven: 38.5 mmHg — ABNORMAL LOW (ref 45.0–50.0)
pH, Ven: 7.423 — ABNORMAL HIGH (ref 7.250–7.300)
pO2, Ven: 33 mmHg (ref 30.0–45.0)

## 2011-01-03 LAB — URIC ACID: Uric Acid, Serum: 5.7 mg/dL (ref 4.0–7.8)

## 2011-01-03 LAB — APTT: aPTT: 34 seconds (ref 24–37)

## 2011-01-03 LAB — HEMOGLOBIN AND HEMATOCRIT, BLOOD
HCT: 30.3 % — ABNORMAL LOW (ref 39.0–52.0)
Hemoglobin: 10.5 g/dL — ABNORMAL LOW (ref 13.0–17.0)

## 2011-01-03 LAB — POCT I-STAT GLUCOSE
Glucose, Bld: 108 mg/dL — ABNORMAL HIGH (ref 70–99)
Glucose, Bld: 109 mg/dL — ABNORMAL HIGH (ref 70–99)
Operator id: 238831
Operator id: 3390

## 2011-01-03 LAB — PROTIME-INR
INR: 1.3 (ref 0.00–1.49)
Prothrombin Time: 16.6 seconds — ABNORMAL HIGH (ref 11.6–15.2)

## 2011-01-03 LAB — PLATELET COUNT: Platelets: 118 10*3/uL — ABNORMAL LOW (ref 150–400)

## 2012-06-02 ENCOUNTER — Encounter: Payer: Self-pay | Admitting: Physician Assistant

## 2012-06-24 ENCOUNTER — Encounter: Payer: Self-pay | Admitting: Cardiovascular Disease

## 2012-09-09 ENCOUNTER — Ambulatory Visit: Payer: Self-pay | Admitting: Cardiovascular Disease

## 2012-11-10 ENCOUNTER — Ambulatory Visit: Payer: Self-pay | Admitting: Cardiovascular Disease

## 2012-11-17 ENCOUNTER — Ambulatory Visit: Payer: Self-pay | Admitting: Cardiovascular Disease

## 2012-11-24 ENCOUNTER — Ambulatory Visit: Payer: Self-pay | Admitting: Cardiovascular Disease

## 2012-11-24 ENCOUNTER — Encounter: Payer: Self-pay | Admitting: Cardiovascular Disease

## 2012-11-24 ENCOUNTER — Ambulatory Visit (INDEPENDENT_AMBULATORY_CARE_PROVIDER_SITE_OTHER): Payer: BC Managed Care – PPO | Admitting: Cardiovascular Disease

## 2012-11-24 VITALS — BP 141/81 | HR 74 | Ht 72.0 in | Wt 247.5 lb

## 2012-11-24 DIAGNOSIS — Z79899 Other long term (current) drug therapy: Secondary | ICD-10-CM

## 2012-11-24 DIAGNOSIS — F1010 Alcohol abuse, uncomplicated: Secondary | ICD-10-CM

## 2012-11-24 DIAGNOSIS — I10 Essential (primary) hypertension: Secondary | ICD-10-CM

## 2012-11-24 DIAGNOSIS — I255 Ischemic cardiomyopathy: Secondary | ICD-10-CM

## 2012-11-24 DIAGNOSIS — I493 Ventricular premature depolarization: Secondary | ICD-10-CM

## 2012-11-24 DIAGNOSIS — E781 Pure hyperglyceridemia: Secondary | ICD-10-CM

## 2012-11-24 DIAGNOSIS — I4949 Other premature depolarization: Secondary | ICD-10-CM

## 2012-11-24 DIAGNOSIS — E785 Hyperlipidemia, unspecified: Secondary | ICD-10-CM

## 2012-11-24 DIAGNOSIS — I739 Peripheral vascular disease, unspecified: Secondary | ICD-10-CM

## 2012-11-24 DIAGNOSIS — I2510 Atherosclerotic heart disease of native coronary artery without angina pectoris: Secondary | ICD-10-CM

## 2012-11-24 DIAGNOSIS — I2589 Other forms of chronic ischemic heart disease: Secondary | ICD-10-CM

## 2012-11-24 DIAGNOSIS — Z7289 Other problems related to lifestyle: Secondary | ICD-10-CM

## 2012-11-24 NOTE — Patient Instructions (Addendum)
Your physician recommends that you schedule a follow-up appointment in: 12 months Your physician encouraged you to lose weight for better health. For your overall cardiovascular health, please limit alcohol intake to no more than an average of 2 alcoholic beverages a day Your physician recommends that you return for lab work in the fasting state as soon as possible.

## 2012-11-25 ENCOUNTER — Ambulatory Visit: Payer: Self-pay | Admitting: Cardiovascular Disease

## 2012-11-27 ENCOUNTER — Encounter: Payer: Self-pay | Admitting: Cardiovascular Disease

## 2012-11-27 DIAGNOSIS — I493 Ventricular premature depolarization: Secondary | ICD-10-CM | POA: Insufficient documentation

## 2012-11-27 DIAGNOSIS — I2510 Atherosclerotic heart disease of native coronary artery without angina pectoris: Secondary | ICD-10-CM | POA: Insufficient documentation

## 2012-11-27 DIAGNOSIS — I255 Ischemic cardiomyopathy: Secondary | ICD-10-CM | POA: Insufficient documentation

## 2012-11-27 DIAGNOSIS — I739 Peripheral vascular disease, unspecified: Secondary | ICD-10-CM | POA: Insufficient documentation

## 2012-11-27 DIAGNOSIS — I10 Essential (primary) hypertension: Secondary | ICD-10-CM | POA: Insufficient documentation

## 2012-11-27 DIAGNOSIS — E7800 Pure hypercholesterolemia, unspecified: Secondary | ICD-10-CM | POA: Insufficient documentation

## 2012-11-27 NOTE — Assessment & Plan Note (Signed)
He tells me that this is a lifelong issue. His PVCs are monomorphic in a pattern of quadrigeminal me today. They have a monophasic left bundle branch block pattern with superior axis and a precordial transition zone in V5 suggesting possible origin from an apical scar. Other than continuing beta blocker therapy I do not think more aggressive intervention is necessary at this time. Increase potassium intake in his diet.

## 2012-11-27 NOTE — Assessment & Plan Note (Signed)
Currently asymptomatic. His most recent imaging procedures were performed at [facility]. Unfortunately do not have those results available for review.

## 2012-11-27 NOTE — Assessment & Plan Note (Signed)
Dramatic. His most recent nuclear study performed in 2011 showed scars in the distal LAD artery distribution as well as a possible basal inferior wall scar, but no reversible ischemia. The mainstay of therapy will be risk factor modification.

## 2012-11-27 NOTE — Assessment & Plan Note (Signed)
No clinical manifestations of congestive heart failure. He is on appropriate treatment with an angiotensin receptor blocker and beta blocker. He does not require diuretics. He appears to be clinically euvolemic. New [place] heart Association functional class 1.
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
## 2012-11-27 NOTE — Assessment & Plan Note (Signed)
Borderline high blood pressure today, although considerably better than at his last visit. He states that often at home his blood pressure is much lower even reaching the 90s over 60s. No changes are made to his antihypertensive medications today. He is reminded about the importance of sodium restriction.

## 2012-11-27 NOTE — Assessment & Plan Note (Signed)
His problems have been primarily with hypertriglyceridemia. On statin his lipid levels have been good. He does not have diabetes mellitus. Excessive alcohol consumption may be contributing to his elevated triglyceride levels. His weight is also a contributing factor I have recommended that he limit alcohol consumption to no more than 2 alcoholic beverages a day. Regular physical exercise and weight loss was also indicated. He should limit his intake of sweets and starches with low glycemic index. At this point in time I am reluctant to recommend a fibrate has adjunctive medicine to his statin, at least until he reduces his alcohol use.

## 2012-12-03 LAB — COMPREHENSIVE METABOLIC PANEL
Alkaline Phosphatase: 74 U/L (ref 39–117)
CO2: 29 mEq/L (ref 19–32)
Creat: 1.01 mg/dL (ref 0.50–1.35)
Glucose, Bld: 105 mg/dL — ABNORMAL HIGH (ref 70–99)
Total Bilirubin: 0.7 mg/dL (ref 0.3–1.2)

## 2012-12-03 LAB — LIPID PANEL
Cholesterol: 188 mg/dL (ref 0–200)
Total CHOL/HDL Ratio: 4.2 Ratio
Triglycerides: 246 mg/dL — ABNORMAL HIGH (ref <150)
VLDL: 49 mg/dL — ABNORMAL HIGH (ref 0–40)

## 2012-12-08 ENCOUNTER — Other Ambulatory Visit: Payer: Self-pay | Admitting: Cardiovascular Disease

## 2012-12-08 ENCOUNTER — Encounter: Payer: Self-pay | Admitting: *Deleted

## 2012-12-24 ENCOUNTER — Other Ambulatory Visit: Payer: Self-pay | Admitting: Cardiovascular Disease

## 2012-12-27 NOTE — Telephone Encounter (Signed)
Patient has an allergy to Simvastatin. Rx refused.

## 2012-12-28 ENCOUNTER — Other Ambulatory Visit: Payer: Self-pay | Admitting: *Deleted

## 2012-12-28 NOTE — Telephone Encounter (Signed)
Refill request refused r/t allergy/contraindication

## 2012-12-31 ENCOUNTER — Telehealth: Payer: Self-pay | Admitting: Cardiovascular Disease

## 2012-12-31 MED ORDER — SIMVASTATIN 20 MG PO TABS: 20.0000 mg | ORAL_TABLET | Freq: Every day | ORAL | Status: DC

## 2012-12-31 NOTE — Telephone Encounter (Signed)
Yes, he is supposed to take simvastatin 20 mg daily

## 2012-12-31 NOTE — Telephone Encounter (Signed)
Spoke with patient who informed RN that at last OV was told to start back on cholesterol medication - simvastatin 20mg  QD. Message sent to Dr. Royann Shivers to confirm, as was not explicit in last office note.

## 2012-12-31 NOTE — Telephone Encounter (Signed)
Please call-still have not gotten his medicine.

## 2012-12-31 NOTE — Telephone Encounter (Signed)
Simvastatin 20mg  ordered and sent to pharmacy electronically per Dr. Salena Saner.

## 2013-01-03 ENCOUNTER — Other Ambulatory Visit: Payer: Self-pay

## 2013-01-03 MED ORDER — SIMVASTATIN 20 MG PO TABS: 20.0000 mg | ORAL_TABLET | Freq: Every day | ORAL | Status: DC

## 2013-01-03 NOTE — Telephone Encounter (Signed)
Rx was sent to pharmacy electronically. 

## 2013-01-08 ENCOUNTER — Other Ambulatory Visit: Payer: Self-pay | Admitting: Cardiovascular Disease

## 2013-01-10 NOTE — Telephone Encounter (Signed)
Rx was sent to pharmacy electronically. 

## 2013-02-03 ENCOUNTER — Other Ambulatory Visit: Payer: Self-pay

## 2013-04-15 ENCOUNTER — Other Ambulatory Visit: Payer: Self-pay | Admitting: Cardiovascular Disease

## 2013-04-15 NOTE — Telephone Encounter (Signed)
Rx was sent to pharmacy electronically. 

## 2013-04-18 NOTE — Telephone Encounter (Signed)
Rx was sent to pharmacy electronically. 

## 2013-08-01 ENCOUNTER — Other Ambulatory Visit: Payer: Self-pay | Admitting: Family

## 2013-08-01 DIAGNOSIS — E049 Nontoxic goiter, unspecified: Secondary | ICD-10-CM

## 2013-08-02 ENCOUNTER — Other Ambulatory Visit: Payer: BC Managed Care – PPO

## 2013-08-03 ENCOUNTER — Ambulatory Visit
Admission: RE | Admit: 2013-08-03 | Discharge: 2013-08-03 | Disposition: A | Discharge status: Home / Self Care | Payer: BC Managed Care – PPO | Source: Ambulatory Visit | Attending: Family | Admitting: Family

## 2013-08-03 DIAGNOSIS — E049 Nontoxic goiter, unspecified: Secondary | ICD-10-CM

## 2013-11-14 ENCOUNTER — Other Ambulatory Visit: Payer: Self-pay | Admitting: Cardiovascular Disease

## 2013-12-28 ENCOUNTER — Encounter: Payer: Self-pay | Admitting: Cardiovascular Disease

## 2013-12-28 ENCOUNTER — Ambulatory Visit (INDEPENDENT_AMBULATORY_CARE_PROVIDER_SITE_OTHER): Payer: BC Managed Care – PPO | Admitting: Cardiovascular Disease

## 2013-12-28 ENCOUNTER — Other Ambulatory Visit: Payer: Self-pay | Admitting: Cardiovascular Disease

## 2013-12-28 VITALS — BP 147/93 | HR 93 | Resp 16 | Ht 66.0 in | Wt 252.7 lb

## 2013-12-28 DIAGNOSIS — I493 Ventricular premature depolarization: Secondary | ICD-10-CM

## 2013-12-28 DIAGNOSIS — E785 Hyperlipidemia, unspecified: Secondary | ICD-10-CM

## 2013-12-28 DIAGNOSIS — I739 Peripheral vascular disease, unspecified: Secondary | ICD-10-CM

## 2013-12-28 DIAGNOSIS — I4949 Other premature depolarization: Secondary | ICD-10-CM

## 2013-12-28 DIAGNOSIS — I25110 Atherosclerotic heart disease of native coronary artery with unstable angina pectoris: Secondary | ICD-10-CM

## 2013-12-28 DIAGNOSIS — I200 Unstable angina: Secondary | ICD-10-CM

## 2013-12-28 DIAGNOSIS — I10 Essential (primary) hypertension: Secondary | ICD-10-CM

## 2013-12-28 DIAGNOSIS — I255 Ischemic cardiomyopathy: Secondary | ICD-10-CM

## 2013-12-28 DIAGNOSIS — I2589 Other forms of chronic ischemic heart disease: Secondary | ICD-10-CM

## 2013-12-28 DIAGNOSIS — I2510 Atherosclerotic heart disease of native coronary artery without angina pectoris: Secondary | ICD-10-CM

## 2013-12-28 NOTE — Patient Instructions (Signed)
Dr. Croitoru recommends that you schedule a follow-up appointment in: One year.   

## 2013-12-28 NOTE — Telephone Encounter (Signed)
Rx was sent to pharmacy electronically. 

## 2013-12-29 ENCOUNTER — Encounter: Payer: Self-pay | Admitting: Cardiovascular Disease
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
## 2014-03-04 ENCOUNTER — Other Ambulatory Visit: Payer: Self-pay | Admitting: Cardiovascular Disease

## 2014-03-06 NOTE — Telephone Encounter (Signed)
Rx was sent to pharmacy electronically. 

## 2014-04-19 ENCOUNTER — Other Ambulatory Visit: Payer: Self-pay | Admitting: Dermatology

## 2014-09-22 ENCOUNTER — Other Ambulatory Visit: Payer: Self-pay | Admitting: Cardiovascular Disease

## 2014-09-22 NOTE — Telephone Encounter (Signed)
REFILL 

## 2014-11-17 ENCOUNTER — Other Ambulatory Visit: Payer: Self-pay | Admitting: Cardiovascular Disease

## 2014-11-17 NOTE — Telephone Encounter (Signed)
REFILL 

## 2014-11-21 ENCOUNTER — Other Ambulatory Visit: Payer: Self-pay | Admitting: Cardiovascular Disease

## 2014-11-21 NOTE — Telephone Encounter (Signed)
Rx(s) sent to pharmacy electronically.  

## 2015-01-08 ENCOUNTER — Other Ambulatory Visit: Payer: Self-pay

## 2015-01-08 ENCOUNTER — Other Ambulatory Visit: Payer: Self-pay | Admitting: Cardiovascular Disease

## 2015-01-08 MED ORDER — METOPROLOL TARTRATE 50 MG PO TABS: 50.0000 mg | ORAL_TABLET | Freq: Two times a day (BID) | ORAL | Status: DC

## 2015-01-22 ENCOUNTER — Other Ambulatory Visit: Payer: Self-pay | Admitting: Cardiovascular Disease

## 2015-01-23 ENCOUNTER — Other Ambulatory Visit: Payer: Self-pay | Admitting: Cardiovascular Disease

## 2015-01-30 ENCOUNTER — Encounter: Payer: Self-pay | Admitting: Cardiovascular Disease

## 2015-02-15 ENCOUNTER — Encounter: Payer: Self-pay | Admitting: Cardiovascular Disease

## 2015-02-15 ENCOUNTER — Ambulatory Visit (INDEPENDENT_AMBULATORY_CARE_PROVIDER_SITE_OTHER): Payer: BLUE CROSS/BLUE SHIELD | Admitting: Cardiovascular Disease

## 2015-02-15 VITALS — BP 138/84 | HR 70 | Ht 72.0 in | Wt 258.7 lb

## 2015-02-15 DIAGNOSIS — I2510 Atherosclerotic heart disease of native coronary artery without angina pectoris: Secondary | ICD-10-CM | POA: Diagnosis not present

## 2015-02-15 DIAGNOSIS — E785 Hyperlipidemia, unspecified: Secondary | ICD-10-CM | POA: Diagnosis not present

## 2015-02-15 DIAGNOSIS — Z79899 Other long term (current) drug therapy: Secondary | ICD-10-CM | POA: Diagnosis not present

## 2015-02-15 MED ORDER — METOPROLOL TARTRATE 50 MG PO TABS: 25.0000 mg | ORAL_TABLET | Freq: Two times a day (BID) | ORAL | Status: DC

## 2015-02-15 MED ORDER — SIMVASTATIN 40 MG PO TABS: 40.0000 mg | ORAL_TABLET | Freq: Every day | ORAL | Status: DC

## 2015-02-15 NOTE — Patient Instructions (Signed)
Your physician has recommended you make the following change in your medication:   INCREASE SIMVASTATIN TO 40 MG DAILY  DECREASE METOPROLOL TO 25 MG TWICE DAILY  Your physician recommends that you return for lab work in: 3 MONTHS FASTING AT [facility]  Dr. Sallyanne Kuster recommends that you schedule a follow-up appointment in: [place]
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
## 2015-02-19 ENCOUNTER — Other Ambulatory Visit: Payer: Self-pay | Admitting: Cardiovascular Disease

## 2015-02-26 ENCOUNTER — Ambulatory Visit: Payer: Self-pay | Admitting: Cardiovascular Disease

## 2015-03-19 ENCOUNTER — Other Ambulatory Visit: Payer: Self-pay | Admitting: Cardiovascular Disease

## 2016-02-13 ENCOUNTER — Other Ambulatory Visit: Payer: Self-pay | Admitting: Cardiovascular Disease

## 2016-03-12 ENCOUNTER — Other Ambulatory Visit: Payer: Self-pay | Admitting: Family

## 2016-03-12 DIAGNOSIS — E041 Nontoxic single thyroid nodule: Secondary | ICD-10-CM

## 2016-03-14 ENCOUNTER — Other Ambulatory Visit: Payer: Self-pay | Admitting: Cardiovascular Disease

## 2016-03-18 ENCOUNTER — Ambulatory Visit
Admission: RE | Admit: 2016-03-18 | Discharge: 2016-03-18 | Disposition: A | Discharge status: Home / Self Care | Payer: BLUE CROSS/BLUE SHIELD | Source: Ambulatory Visit | Attending: Family | Admitting: Family

## 2016-03-18 DIAGNOSIS — E041 Nontoxic single thyroid nodule: Secondary | ICD-10-CM

## 2016-04-07 ENCOUNTER — Ambulatory Visit (INDEPENDENT_AMBULATORY_CARE_PROVIDER_SITE_OTHER): Payer: BLUE CROSS/BLUE SHIELD | Admitting: Cardiovascular Disease

## 2016-04-07 ENCOUNTER — Encounter: Payer: Self-pay | Admitting: Cardiovascular Disease

## 2016-04-07 VITALS — BP 140/88 | HR 69 | Ht 73.0 in | Wt 260.0 lb

## 2016-04-07 DIAGNOSIS — R7303 Prediabetes: Secondary | ICD-10-CM | POA: Insufficient documentation

## 2016-04-07 DIAGNOSIS — I10 Essential (primary) hypertension: Secondary | ICD-10-CM | POA: Diagnosis not present

## 2016-04-07 DIAGNOSIS — E782 Mixed hyperlipidemia: Secondary | ICD-10-CM | POA: Diagnosis not present

## 2016-04-07 DIAGNOSIS — I2510 Atherosclerotic heart disease of native coronary artery without angina pectoris: Secondary | ICD-10-CM

## 2016-04-07 DIAGNOSIS — I739 Peripheral vascular disease, unspecified: Secondary | ICD-10-CM
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
## 2016-04-07 NOTE — Patient Instructions (Signed)
Dr Croitoru recommends that you schedule a follow-up appointment in 12 months. You will receive a reminder letter in the mail two months in advance. If you don't receive a letter, please call our office to schedule the follow-up appointment.  If you need a refill on your cardiac medications before your next appointment, please call your pharmacy.   Your physician encouraged you to lose weight for better health. 

## 2016-04-14 ENCOUNTER — Other Ambulatory Visit: Payer: Self-pay | Admitting: Cardiovascular Disease

## 2016-05-14 ENCOUNTER — Other Ambulatory Visit: Payer: Self-pay | Admitting: Cardiovascular Disease

## 2016-05-14 NOTE — Telephone Encounter (Signed)
Rx(s) sent to pharmacy electronically.  

## 2016-05-26 ENCOUNTER — Other Ambulatory Visit: Payer: Self-pay | Admitting: Cardiovascular Disease

## 2017-01-12 ENCOUNTER — Ambulatory Visit (INDEPENDENT_AMBULATORY_CARE_PROVIDER_SITE_OTHER): Payer: BLUE CROSS/BLUE SHIELD | Admitting: Orthopaedic Surgery

## 2017-01-12 ENCOUNTER — Ambulatory Visit (INDEPENDENT_AMBULATORY_CARE_PROVIDER_SITE_OTHER): Payer: BLUE CROSS/BLUE SHIELD

## 2017-01-12 ENCOUNTER — Encounter (INDEPENDENT_AMBULATORY_CARE_PROVIDER_SITE_OTHER): Payer: Self-pay | Admitting: Orthopaedic Surgery

## 2017-01-12 DIAGNOSIS — M1711 Unilateral primary osteoarthritis, right knee: Secondary | ICD-10-CM

## 2017-01-12 MED ORDER — MELOXICAM 7.5 MG PO TABS: 7.5000 mg | ORAL_TABLET | Freq: Two times a day (BID) | ORAL | 2 refills | Status: DC | PRN

## 2017-01-12 MED ORDER — DICLOFENAC SODIUM 1 % TD GEL: 2.0000 g | Freq: Four times a day (QID) | TRANSDERMAL | 5 refills | Status: DC
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
## 2017-02-25 ENCOUNTER — Other Ambulatory Visit (INDEPENDENT_AMBULATORY_CARE_PROVIDER_SITE_OTHER): Payer: Self-pay | Admitting: Orthopaedic Surgery

## 2017-03-26 ENCOUNTER — Other Ambulatory Visit (INDEPENDENT_AMBULATORY_CARE_PROVIDER_SITE_OTHER): Payer: Self-pay | Admitting: Orthopaedic Surgery

## 2017-05-01 ENCOUNTER — Telehealth: Payer: Self-pay | Admitting: Cardiovascular Disease

## 2017-05-01 NOTE — Telephone Encounter (Signed)
If he is dizzy/lightheaded or has severe fatigue, please stop the losartan. Otherwise, please ask to try to continue taking it (it was indicated for mildly decreased LVEF, not just HTN). Please ask him to check BP in both arms and confirm that they are roughly equal. If they are different, the higher one is the "real" BP. MCr

## 2017-05-01 NOTE — Telephone Encounter (Signed)
Returned call to pt he states that his BP has been running low lately(couple weeks). He states that he has lost 30 lb's and his BP has been running too low, it has been running 80-1120/39-60 he states that he has stopped his metoprolol and this helped a little but he states that she should stop his losartan, he cut it in 1/2 today and states that it is still running low. He would like to stop this also and see if this helps. Right now after taking his am medications 81/39 HR 81, earlier this morning before medications it was 115/58 HR 40. Is it ok to stop losartan? I scheduled an appt 05-04-17. Please advise

## 2017-05-01 NOTE — Telephone Encounter (Signed)
New Message  Pt c/o medication issue:  1. Name of Medication: losartan   2. How are you currently taking this medication (dosage and times per day)? 1003g   3. Are you having a reaction (difficulty breathing--STAT)? A little dizzy and fatigue  4. What is your medication issue? Patient states that his bp been running 83/49 with a pulse of 41. He is wondering should the dosage be half of should he discontinue this medication. Please call.

## 2017-05-01 NOTE — Telephone Encounter (Signed)
Pt states that he is not what he would consider severely fatigued or lightheaded, he states just a little light headed. He will continue losartan and taking BP in both arms until Monday and bring the log of BP with him to appt
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
## 2017-05-04 ENCOUNTER — Encounter: Payer: Self-pay | Admitting: Adult Health

## 2017-05-04 ENCOUNTER — Ambulatory Visit (INDEPENDENT_AMBULATORY_CARE_PROVIDER_SITE_OTHER): Payer: BLUE CROSS/BLUE SHIELD | Admitting: Adult Health

## 2017-05-04 VITALS — BP 116/60 | HR 44 | Ht 73.0 in | Wt 247.8 lb

## 2017-05-04 DIAGNOSIS — R5383 Other fatigue: Secondary | ICD-10-CM

## 2017-05-04 DIAGNOSIS — K219 Gastro-esophageal reflux disease without esophagitis: Secondary | ICD-10-CM

## 2017-05-04 DIAGNOSIS — I10 Essential (primary) hypertension: Secondary | ICD-10-CM | POA: Diagnosis not present

## 2017-05-04 DIAGNOSIS — R0989 Other specified symptoms and signs involving the circulatory and respiratory systems: Secondary | ICD-10-CM | POA: Insufficient documentation

## 2017-05-04 DIAGNOSIS — I493 Ventricular premature depolarization: Secondary | ICD-10-CM | POA: Diagnosis not present

## 2017-05-04 DIAGNOSIS — I2510 Atherosclerotic heart disease of native coronary artery without angina pectoris: Secondary | ICD-10-CM

## 2017-05-04 DIAGNOSIS — E7800 Pure hypercholesterolemia, unspecified: Secondary | ICD-10-CM

## 2017-05-04 NOTE — Patient Instructions (Signed)
Medication Instructions:  NO CHANGES If you need a refill on your cardiac medications before your next appointment, please call your pharmacy.  Labwork: TSH, FLP, LFT, MAG AND CBC HERE IN OUR OFFICE AT [facility]  PLEASE RETURN FASTING FOR LAB WORK  You may go to any [facility] lab that is convenient for you however, we do have a lab in our office that is able to assist you. You do NOT need an appointment for our lab. Once in our office lobby there is a podium to the right of the check-in desk where you are to sign-in and ring a doorbell to alert [place] you are here. Lab is open Monday-Friday from 8:00am to 4:00pm; and is closed for lunch from 12:45p-1:45pm   Testing/Procedures: Echocardiogram - Your physician has requested that you have an echocardiogram. Echocardiography is a painless test that uses sound waves to create images of your heart. It provides your doctor with information about the size and shape of your heart and how well your heart's chambers and valves are working. This procedure takes approximately one hour. There are no restrictions for this procedure. This will be performed at our [facility] location - [address].  Follow-Up: Your physician wants you to follow-up:  [place] 06-02-17.   Thank you for choosing [facility] at [facility]!!

## 2017-05-05 LAB — HEPATIC FUNCTION PANEL
ALBUMIN: 4.3 g/dL (ref 3.6–4.8)
ALT: 26 IU/L (ref 0–44)
AST: 18 IU/L (ref 0–40)
Alkaline Phosphatase: 91 IU/L (ref 39–117)
BILIRUBIN TOTAL: 0.5 mg/dL (ref 0.0–1.2)
Bilirubin, Direct: 0.17 mg/dL (ref 0.00–0.40)
Total Protein: 6.6 g/dL (ref 6.0–8.5)

## 2017-05-05 LAB — CBC
Hematocrit: 44.6 % (ref 37.5–51.0)
Hemoglobin: 15.5 g/dL (ref 13.0–17.7)
MCH: 31 pg (ref 26.6–33.0)
MCHC: 34.8 g/dL (ref 31.5–35.7)
MCV: 89 fL (ref 79–97)
PLATELETS: 179 10*3/uL (ref 150–379)
RBC: 5 x10E6/uL (ref 4.14–5.80)
RDW: 13.1 % (ref 12.3–15.4)
WBC: 4.9 10*3/uL (ref 3.4–10.8)

## 2017-05-05 LAB — MAGNESIUM: Magnesium: 2.1 mg/dL (ref 1.6–2.3)

## 2017-05-05 LAB — LIPID PANEL
CHOL/HDL RATIO: 3.1 ratio (ref 0.0–5.0)
CHOLESTEROL TOTAL: 125 mg/dL (ref 100–199)
HDL: 40 mg/dL (ref >39)
LDL CALC: 69 mg/dL (ref 0–99)
Triglycerides: 78 mg/dL (ref 0–149)
VLDL Cholesterol Cal: 16 mg/dL (ref 5–40)

## 2017-05-05 LAB — TSH: TSH: 1.37 u[IU]/mL (ref 0.450–4.500)

## 2017-05-07 ENCOUNTER — Other Ambulatory Visit: Payer: Self-pay | Admitting: Cardiovascular Disease

## 2017-05-07 NOTE — Telephone Encounter (Signed)
REFILL 

## 2017-05-08 ENCOUNTER — Other Ambulatory Visit: Payer: Self-pay

## 2017-05-08 ENCOUNTER — Ambulatory Visit (HOSPITAL_COMMUNITY): Payer: BLUE CROSS/BLUE SHIELD | Attending: Cardiovascular Disease

## 2017-05-08 DIAGNOSIS — I10 Essential (primary) hypertension: Secondary | ICD-10-CM

## 2017-05-08 DIAGNOSIS — R0989 Other specified symptoms and signs involving the circulatory and respiratory systems: Secondary | ICD-10-CM | POA: Insufficient documentation

## 2017-05-08 DIAGNOSIS — I2510 Atherosclerotic heart disease of native coronary artery without angina pectoris: Secondary | ICD-10-CM | POA: Insufficient documentation

## 2017-05-08 DIAGNOSIS — I252 Old myocardial infarction: Secondary | ICD-10-CM | POA: Insufficient documentation

## 2017-05-08 DIAGNOSIS — R5383 Other fatigue: Secondary | ICD-10-CM | POA: Diagnosis not present

## 2017-05-08 DIAGNOSIS — R29898 Other symptoms and signs involving the musculoskeletal system: Secondary | ICD-10-CM | POA: Diagnosis not present

## 2017-05-08 DIAGNOSIS — I059 Rheumatic mitral valve disease, unspecified: Secondary | ICD-10-CM | POA: Diagnosis not present

## 2017-05-08 DIAGNOSIS — Z951 Presence of aortocoronary bypass graft: Secondary | ICD-10-CM | POA: Insufficient documentation

## 2017-05-08 DIAGNOSIS — I119 Hypertensive heart disease without heart failure: Secondary | ICD-10-CM | POA: Diagnosis not present

## 2017-05-08 MED ORDER — PERFLUTREN LIPID MICROSPHERE
1.0000 mL | INTRAVENOUS | Status: AC | PRN
  Administered 2017-05-08: 2 mL via INTRAVENOUS

## 2017-05-11 ENCOUNTER — Telehealth: Payer: Self-pay | Admitting: Cardiovascular Disease

## 2017-05-11 MED ORDER — LOSARTAN POTASSIUM 25 MG PO TABS: 25.0000 mg | ORAL_TABLET | Freq: Every day | ORAL | 5 refills | Status: DC

## 2017-05-11 MED ORDER — METOPROLOL TARTRATE 25 MG PO TABS: ORAL_TABLET | ORAL | 5 refills | Status: DC

## 2017-05-11 NOTE — Telephone Encounter (Signed)
Advised patient of results and new medications sent to pharmacy   Notes recorded by Waylan Rocher, LPN on 2/82/0601 at 5:61 AM EST Sent message to schedule appt ------  Notes recorded by Waylan Rocher, LPN on 5/37/9432 at 7:61 AM EST Lm2cb ------  Notes recorded by Lendon Colonel, NP on 05/11/2017 at 6:58 AM EST Patients heart is in weakened stated with decreased EF from 40% down to 30%=35%. He will need to go back on low dose metoprolol 12.5 mg BID, Decrease losartan to 25 mg daily.   He will need to be established with CHF clinic with decreasing EF compared to previous. His BP will need to be lower in this setting of reduced EF. 110/50 is ok.

## 2017-05-11 NOTE — Telephone Encounter (Signed)
New message    Patient returning call from last week - please called after lunch

## 2017-05-12 ENCOUNTER — Telehealth (HOSPITAL_COMMUNITY): Payer: Self-pay | Admitting: Cardiology

## 2017-05-12 NOTE — Telephone Encounter (Signed)
Called and left VM for patient to call the office.  Need to give patient appt info for New CHF appt scheduled with Dr. Aundra Dubin

## 2017-05-13 ENCOUNTER — Telehealth: Payer: Self-pay | Admitting: Adult Health

## 2017-05-13 NOTE — Telephone Encounter (Signed)
New Message   Patient is calling to discuss his referral to the heart failure clinic as well as his echocardiogram results. Please call to discuss.

## 2017-05-13 NOTE — Telephone Encounter (Signed)
F/U Call: ° °Patient returning call °

## 2017-05-13 NOTE — Telephone Encounter (Signed)
Returned call to pt, all questions answered he will discuss questions at upcoming appt w/Dr Aundra Dubin

## 2017-05-13 NOTE — Telephone Encounter (Signed)
Lm2cb 

## 2017-06-02 ENCOUNTER — Encounter: Payer: Self-pay | Admitting: Cardiovascular Disease

## 2017-06-02 ENCOUNTER — Telehealth: Payer: Self-pay | Admitting: Cardiovascular Disease

## 2017-06-02 ENCOUNTER — Ambulatory Visit (INDEPENDENT_AMBULATORY_CARE_PROVIDER_SITE_OTHER): Payer: BLUE CROSS/BLUE SHIELD | Admitting: Cardiovascular Disease

## 2017-06-02 VITALS — BP 116/72 | HR 72 | Ht 71.0 in | Wt 244.0 lb

## 2017-06-02 DIAGNOSIS — I25708 Atherosclerosis of coronary artery bypass graft(s), unspecified, with other forms of angina pectoris: Secondary | ICD-10-CM

## 2017-06-02 DIAGNOSIS — I5022 Chronic systolic (congestive) heart failure: Secondary | ICD-10-CM | POA: Diagnosis not present

## 2017-06-02 DIAGNOSIS — I739 Peripheral vascular disease, unspecified: Secondary | ICD-10-CM

## 2017-06-02 DIAGNOSIS — E669 Obesity, unspecified: Secondary | ICD-10-CM | POA: Diagnosis not present

## 2017-06-02 DIAGNOSIS — Z01812 Encounter for preprocedural laboratory examination: Secondary | ICD-10-CM

## 2017-06-02 DIAGNOSIS — R7303 Prediabetes: Secondary | ICD-10-CM | POA: Diagnosis not present

## 2017-06-02 DIAGNOSIS — E7800 Pure hypercholesterolemia, unspecified: Secondary | ICD-10-CM

## 2017-06-02 DIAGNOSIS — I10 Essential (primary) hypertension: Secondary | ICD-10-CM | POA: Diagnosis not present

## 2017-06-02 MED ORDER — ASPIRIN EC 81 MG PO TBEC: 81.0000 mg | DELAYED_RELEASE_TABLET | Freq: Every day | ORAL | 3 refills | Status: DC

## 2017-06-02 NOTE — Patient Instructions (Signed)
Dr Sallyanne Kuster has recommended making the following medication changes: 1. START Aspirin 81 mg daily  Your physician recommends that you schedule a follow-up appointment in 6 weeks. An appointment has been made for 07/14/17.    [facility] [address] [place] [address] Dept: [phone] Loc: [facility]  06/02/2017  You are scheduled for a Cardiac Catheterization on Tuesday, March 12 with Dr. Larae Grooms.  1. Please arrive at the [facility] (Main Entrance A) at [facility]: [place], [place] [address] at 8:30 AM (two hours before your procedure to ensure your preparation). Free valet parking service is available.  Special note: Every effort is made to have your procedure done on time. Please understand that emergencies sometimes delay scheduled procedures. 2. Diet: Do not eat or drink anything after midnight prior to your procedure except sips of water to take medications. 3. Labs: TODAY. 4. Medication instructions in preparation for your procedure:  - HOLD Losartan on the day of the catheterization. - TAKE 325 mg of Aspirin on the day of the catheterization 5. Plan for one night stay--bring personal belongings. 6. Bring a current list of your medications and current insurance cards. 7. You MUST have a responsible person to drive you home. 8. Someone MUST be with you the first 24 hours after you arrive home or your discharge will be delayed. 9. Please wear clothes that are easy to get on and off and wear slip-on shoes.  Thank you for allowing [place] to care for you!   -- [facility]
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
## 2017-06-02 NOTE — Telephone Encounter (Signed)
Follow UP;    He says he want to reschedule his Cath please.

## 2017-06-03 LAB — PROTIME-INR
INR: 1.1 (ref 0.8–1.2)
Prothrombin Time: 11.4 s (ref 9.1–12.0)

## 2017-06-03 LAB — BASIC METABOLIC PANEL
BUN / CREAT RATIO: 20 (ref 10–24)
BUN: 17 mg/dL (ref 8–27)
CALCIUM: 9.7 mg/dL (ref 8.6–10.2)
CHLORIDE: 101 mmol/L (ref 96–106)
CO2: 25 mmol/L (ref 20–29)
Creatinine, Ser: 0.85 mg/dL (ref 0.76–1.27)
GFR, EST AFRICAN AMERICAN: 105 mL/min/{1.73_m2} (ref >59)
GFR, EST NON AFRICAN AMERICAN: 91 mL/min/{1.73_m2} (ref >59)
Glucose: 107 mg/dL — ABNORMAL HIGH (ref 65–99)
POTASSIUM: 4.8 mmol/L (ref 3.5–5.2)
SODIUM: 142 mmol/L (ref 134–144)

## 2017-06-03 NOTE — Telephone Encounter (Signed)
Cath rescheduled for this Friday, 06/05/17. Patient verbalized understanding and agreed with plan.

## 2017-06-04 ENCOUNTER — Telehealth: Payer: Self-pay | Admitting: *Deleted

## 2017-06-04 NOTE — Telephone Encounter (Signed)
LMTCB for pt to discuss instructions for cath 06/05/17

## 2017-06-04 NOTE — Telephone Encounter (Signed)
Pt contacted pre-catheterization scheduled at [facility] for: Friday June 05, 2017 12 noon. Verified arrival time and place: [place] at: 10 AM  Nothing to eat or drink after midnight prior to cath. Verified allergies in Epic.  Hold: Losartan AM of cath  Except hold medications AM meds can be  taken pre-cath with sip of water including: ASA 81 mg  Confirmed patient has responsible person to drive home post procedure and observe patient for 24 hours: yes

## 2017-06-05 ENCOUNTER — Encounter (HOSPITAL_COMMUNITY)
Admission: RE | Disposition: A | Discharge status: Home / Self Care | Payer: Self-pay | Source: Ambulatory Visit | Attending: Interventional Cardiology

## 2017-06-05 ENCOUNTER — Ambulatory Visit (HOSPITAL_COMMUNITY)
Admission: RE | Admit: 2017-06-05 | Discharge: 2017-06-05 | Disposition: A | Discharge status: Home / Self Care | Payer: BLUE CROSS/BLUE SHIELD | Source: Ambulatory Visit | Attending: Interventional Cardiology | Admitting: Interventional Cardiology

## 2017-06-05 ENCOUNTER — Encounter (HOSPITAL_COMMUNITY): Payer: BLUE CROSS/BLUE SHIELD | Admitting: Cardiology

## 2017-06-05 ENCOUNTER — Ambulatory Visit (INDEPENDENT_AMBULATORY_CARE_PROVIDER_SITE_OTHER): Payer: BLUE CROSS/BLUE SHIELD | Admitting: Orthopaedic Surgery

## 2017-06-05 DIAGNOSIS — M86671 Other chronic osteomyelitis, right ankle and foot: Secondary | ICD-10-CM | POA: Insufficient documentation

## 2017-06-05 DIAGNOSIS — R072 Precordial pain: Secondary | ICD-10-CM

## 2017-06-05 DIAGNOSIS — I739 Peripheral vascular disease, unspecified: Secondary | ICD-10-CM | POA: Insufficient documentation

## 2017-06-05 DIAGNOSIS — I2582 Chronic total occlusion of coronary artery: Secondary | ICD-10-CM | POA: Diagnosis not present

## 2017-06-05 DIAGNOSIS — E7800 Pure hypercholesterolemia, unspecified: Secondary | ICD-10-CM | POA: Insufficient documentation

## 2017-06-05 DIAGNOSIS — Z6834 Body mass index (BMI) 34.0-34.9, adult: Secondary | ICD-10-CM | POA: Diagnosis not present

## 2017-06-05 DIAGNOSIS — I110 Hypertensive heart disease with heart failure: Secondary | ICD-10-CM | POA: Insufficient documentation

## 2017-06-05 DIAGNOSIS — Z87891 Personal history of nicotine dependence: Secondary | ICD-10-CM | POA: Insufficient documentation

## 2017-06-05 DIAGNOSIS — I493 Ventricular premature depolarization: Secondary | ICD-10-CM | POA: Insufficient documentation

## 2017-06-05 DIAGNOSIS — Z885 Allergy status to narcotic agent status: Secondary | ICD-10-CM | POA: Diagnosis not present

## 2017-06-05 DIAGNOSIS — Z951 Presence of aortocoronary bypass graft: Secondary | ICD-10-CM | POA: Diagnosis not present

## 2017-06-05 DIAGNOSIS — Z7982 Long term (current) use of aspirin: Secondary | ICD-10-CM | POA: Insufficient documentation

## 2017-06-05 DIAGNOSIS — I2584 Coronary atherosclerosis due to calcified coronary lesion: Secondary | ICD-10-CM | POA: Insufficient documentation

## 2017-06-05 DIAGNOSIS — E669 Obesity, unspecified: Secondary | ICD-10-CM | POA: Diagnosis not present

## 2017-06-05 DIAGNOSIS — I2510 Atherosclerotic heart disease of native coronary artery without angina pectoris: Secondary | ICD-10-CM | POA: Diagnosis not present

## 2017-06-05 DIAGNOSIS — I255 Ischemic cardiomyopathy: Secondary | ICD-10-CM | POA: Diagnosis not present

## 2017-06-05 DIAGNOSIS — R7303 Prediabetes: Secondary | ICD-10-CM | POA: Insufficient documentation

## 2017-06-05 DIAGNOSIS — Z8674 Personal history of sudden cardiac arrest: Secondary | ICD-10-CM | POA: Insufficient documentation

## 2017-06-05 DIAGNOSIS — I5022 Chronic systolic (congestive) heart failure: Secondary | ICD-10-CM | POA: Diagnosis not present

## 2017-06-05 HISTORY — PX: LEFT HEART CATH AND CORS/GRAFTS ANGIOGRAPHY: CATH118250

## 2017-06-05 LAB — CBC
HCT: 45.1 % (ref 39.0–52.0)
HEMOGLOBIN: 15.5 g/dL (ref 13.0–17.0)
MCH: 30.7 pg (ref 26.0–34.0)
MCHC: 34.4 g/dL (ref 30.0–36.0)
MCV: 89.3 fL (ref 78.0–100.0)
PLATELETS: 153 10*3/uL (ref 150–400)
RBC: 5.05 MIL/uL (ref 4.22–5.81)
RDW: 13.4 % (ref 11.5–15.5)
WBC: 5.1 10*3/uL (ref 4.0–10.5)

## 2017-06-05 SURGERY — LEFT HEART CATH AND CORS/GRAFTS ANGIOGRAPHY
Anesthesia: LOCAL

## 2017-06-05 MED ORDER — SODIUM CHLORIDE 0.9% FLUSH: 3.0000 mL | Freq: Two times a day (BID) | INTRAVENOUS | Status: DC

## 2017-06-05 MED ORDER — MIDAZOLAM HCL 2 MG/2ML IJ SOLN
INTRAMUSCULAR | Status: AC
  Filled 2017-06-05: qty 2

## 2017-06-05 MED ORDER — FENTANYL CITRATE (PF) 100 MCG/2ML IJ SOLN
INTRAMUSCULAR | Status: DC | PRN
  Administered 2017-06-05 (×2): 25 ug via INTRAVENOUS

## 2017-06-05 MED ORDER — MIDAZOLAM HCL 2 MG/2ML IJ SOLN
INTRAMUSCULAR | Status: DC | PRN
  Administered 2017-06-05: 2 mg via INTRAVENOUS
  Administered 2017-06-05: 1 mg via INTRAVENOUS

## 2017-06-05 MED ORDER — SODIUM CHLORIDE 0.9% FLUSH: 3.0000 mL | INTRAVENOUS | Status: DC | PRN

## 2017-06-05 MED ORDER — SODIUM CHLORIDE 0.9 % IV SOLN: 250.0000 mL | INTRAVENOUS | Status: DC | PRN

## 2017-06-05 MED ORDER — HEPARIN (PORCINE) IN NACL 2-0.9 UNIT/ML-% IJ SOLN
INTRAMUSCULAR | Status: AC | PRN
  Administered 2017-06-05 (×2): 500 mL

## 2017-06-05 MED ORDER — HEPARIN (PORCINE) IN NACL 2-0.9 UNIT/ML-% IJ SOLN
INTRAMUSCULAR | Status: AC
  Filled 2017-06-05: qty 1000

## 2017-06-05 MED ORDER — VERAPAMIL HCL 2.5 MG/ML IV SOLN
INTRAVENOUS | Status: AC
  Filled 2017-06-05: qty 2

## 2017-06-05 MED ORDER — IOPAMIDOL (ISOVUE-370) INJECTION 76%
INTRAVENOUS | Status: DC | PRN
  Administered 2017-06-05: 70 mL via INTRA_ARTERIAL

## 2017-06-05 MED ORDER — LIDOCAINE HCL (PF) 1 % IJ SOLN
INTRAMUSCULAR | Status: DC | PRN
  Administered 2017-06-05: 15 mL via SUBCUTANEOUS

## 2017-06-05 MED ORDER — ASPIRIN 81 MG PO CHEW: 81.0000 mg | CHEWABLE_TABLET | ORAL | Status: DC

## 2017-06-05 MED ORDER — FENTANYL CITRATE (PF) 100 MCG/2ML IJ SOLN
INTRAMUSCULAR | Status: AC
  Filled 2017-06-05: qty 2

## 2017-06-05 MED ORDER — IOPAMIDOL (ISOVUE-370) INJECTION 76%
INTRAVENOUS | Status: AC
  Filled 2017-06-05: qty 125

## 2017-06-05 MED ORDER — SODIUM CHLORIDE 0.9 % IV SOLN
INTRAVENOUS | Status: DC
  Administered 2017-06-05: 11:00:00 via INTRAVENOUS

## 2017-06-05 MED ORDER — LIDOCAINE HCL 1 % IJ SOLN
INTRAMUSCULAR | Status: AC
  Filled 2017-06-05: qty 20

## 2017-06-05 SURGICAL SUPPLY — 10 items
CATH INFINITI 5FR MULTPACK ANG (CATHETERS) ×1 IMPLANT
COVER PRB 48X5XTLSCP FOLD TPE (BAG) IMPLANT
COVER PROBE 5X48 (BAG) ×2
KIT HEART LEFT (KITS) ×2 IMPLANT
PACK CARDIAC CATHETERIZATION (CUSTOM PROCEDURE TRAY) ×2 IMPLANT
SHEATH PINNACLE 5F 10CM (SHEATH) ×1 IMPLANT
SYR MEDRAD MARK V 150ML (SYRINGE) ×2 IMPLANT
TRANSDUCER W/STOPCOCK (MISCELLANEOUS) ×2 IMPLANT
TUBING CIL FLEX 10 FLL-RA (TUBING) ×1 IMPLANT
WIRE EMERALD 3MM-J .035X150CM (WIRE) ×1 IMPLANT

## 2017-06-05 NOTE — Interval H&P Note (Signed)
Cath Lab Visit (complete for each Cath Lab visit)  Clinical Evaluation Leading to the Procedure:   ACS: No.  Non-ACS:    Anginal Classification: CCS III  Anti-ischemic medical therapy: Minimal Therapy (1 class of medications)  Non-Invasive Test Results: No non-invasive testing performed  Prior CABG: Previous CABG      History and Physical Interval Note:  06/05/2017 3:37 PM  Darrell Farrell  has presented today for surgery, with the diagnosis of hf  The various methods of treatment have been discussed with the patient and family. After consideration of risks, benefits and other options for treatment, the patient has consented to  Procedure(s): LEFT HEART CATH AND CORS/GRAFTS ANGIOGRAPHY (N/A) as a surgical intervention .  The patient's history has been reviewed, patient examined, no change in status, stable for surgery.  I have reviewed the patient's chart and labs.  Questions were answered to the patient's satisfaction.     Larae Grooms

## 2017-06-05 NOTE — Discharge Instructions (Signed)

## 2017-06-05 NOTE — Progress Notes (Signed)
Site area: rt groin fa sheath  Site Prior to Removal:  Level 0 Pressure Applied For:  20 minutes Manual:   yes Patient Status During Pull:  stable Post Pull Site:  Level  0 Post Pull Instructions Given:  yes Post Pull Pulses Present: palpable Dressing Applied:  Gauze and tegaderm Bedrest begins @ 7034 Comments:

## 2017-06-08 ENCOUNTER — Telehealth: Payer: Self-pay | Admitting: Cardiovascular Disease

## 2017-06-08 ENCOUNTER — Telehealth (INDEPENDENT_AMBULATORY_CARE_PROVIDER_SITE_OTHER): Payer: Self-pay

## 2017-06-08 ENCOUNTER — Ambulatory Visit (INDEPENDENT_AMBULATORY_CARE_PROVIDER_SITE_OTHER): Payer: Medicare Other | Admitting: Orthopaedic Surgery

## 2017-06-08 ENCOUNTER — Encounter (HOSPITAL_COMMUNITY): Payer: Self-pay | Admitting: Interventional Cardiology

## 2017-06-08 DIAGNOSIS — M1711 Unilateral primary osteoarthritis, right knee: Secondary | ICD-10-CM

## 2017-06-08 NOTE — Telephone Encounter (Signed)
Left a message to call back.

## 2017-06-08 NOTE — Telephone Encounter (Signed)
Returned the call to the patient. He recently had a cardiac cath on 06/05/17. Instructions were to continue medical therapy for LV dysfunction. He has an appointment on 4/16 with Dr. Sallyanne Kuster. He would like to know if he needs to be seen sooner and if not then does he keep his medication the same.

## 2017-06-08 NOTE — Telephone Encounter (Signed)
Patient has been called and notified that scheduling will be in touch to set up this appointment.

## 2017-06-08 NOTE — Telephone Encounter (Signed)
New message  Pt would like to know if he needs to schedule a follow up appt from his heart cath surgery he had, sooner than the one he has on 4/16. Please call
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
## 2017-06-08 NOTE — Telephone Encounter (Signed)
Please submit application for Synvisc gel injection Thank you   Dr Erlinda Hong  Synvisc Right knee

## 2017-06-08 NOTE — Telephone Encounter (Signed)
I also want to see him sooner. We will have to squeeze him in sometime next week, please.  MCr

## 2017-06-09 NOTE — Telephone Encounter (Signed)
Submitted application online for SynviscOne, right knee. 

## 2017-06-09 NOTE — Telephone Encounter (Signed)
Returned call to patient. Appt sch for 06/16/17 at 12:00p. Patient verbalized understanding and agreed with plan.

## 2017-06-16 ENCOUNTER — Ambulatory Visit (INDEPENDENT_AMBULATORY_CARE_PROVIDER_SITE_OTHER): Payer: BLUE CROSS/BLUE SHIELD | Admitting: Cardiovascular Disease

## 2017-06-16 ENCOUNTER — Encounter: Payer: Self-pay | Admitting: Cardiovascular Disease

## 2017-06-16 VITALS — BP 123/70 | HR 48 | Ht 71.0 in | Wt 241.8 lb

## 2017-06-16 DIAGNOSIS — Z01812 Encounter for preprocedural laboratory examination: Secondary | ICD-10-CM

## 2017-06-16 DIAGNOSIS — R7303 Prediabetes: Secondary | ICD-10-CM | POA: Diagnosis not present

## 2017-06-16 DIAGNOSIS — I739 Peripheral vascular disease, unspecified: Secondary | ICD-10-CM

## 2017-06-16 DIAGNOSIS — I5042 Chronic combined systolic (congestive) and diastolic (congestive) heart failure: Secondary | ICD-10-CM | POA: Diagnosis not present

## 2017-06-16 DIAGNOSIS — I2510 Atherosclerotic heart disease of native coronary artery without angina pectoris: Secondary | ICD-10-CM | POA: Diagnosis not present

## 2017-06-16 DIAGNOSIS — I10 Essential (primary) hypertension: Secondary | ICD-10-CM

## 2017-06-16 DIAGNOSIS — E7800 Pure hypercholesterolemia, unspecified: Secondary | ICD-10-CM

## 2017-06-16 DIAGNOSIS — E669 Obesity, unspecified: Secondary | ICD-10-CM | POA: Diagnosis not present

## 2017-06-16 DIAGNOSIS — I493 Ventricular premature depolarization: Secondary | ICD-10-CM | POA: Diagnosis not present
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
## 2017-06-16 NOTE — Patient Instructions (Addendum)
Medication Instructions:  Your physician recommends that you continue on your current medications as directed. Please refer to the Current Medication list given to you today.  Labwork: CBC/BMET/PT WITHIN 1 WEEK OF PROCEDURE   Testing/Procedures WILL CALL YOU WITH TIME OF DEFIBRILLATOR IMPLANTATION   Follow-Up: WILL CALL YOU WITH FOLLOW UP DATE  Any Other Special Instructions Will Be Listed Below (If Applicable).   Cardioverter Defibrillator Implantation An implantable cardioverter defibrillator (ICD) is a small device that is placed under the skin in the chest or abdomen. An ICD consists of a battery, a small computer (pulse generator), and wires (leads) that go into the heart. An ICD is used to detect and correct two types of dangerous irregular heartbeats (arrhythmias):  A rapid heart rhythm (tachycardia).  An arrhythmia in which the lower chambers of the heart (ventricles) contract in an uncoordinated way (fibrillation).  When an ICD detects tachycardia, it sends a low-energy shock to the heart to restore the heartbeat to normal (cardioversion). This signal is usually painless. If cardioversion does not work or if the ICD detects fibrillation, it delivers a high-energy shock to the heart (defibrillation) to restart the heart. This shock may feel like a strong jolt in the chest. Your health care provider may prescribe an ICD if:  You have had an arrhythmia that originated in the ventricles.  Your heart has been damaged by a disease or heart condition.  Sometimes, ICDs are programmed to act as a device called a pacemaker. Pacemakers can be used to treat a slow heartbeat (bradycardia) or tachycardia by taking over the heart rate with electrical impulses. Tell a health care provider about:  Any allergies you have.  All medicines you are taking, including vitamins, herbs, eye drops, creams, and over-the-counter medicines.  Any problems you or family members have had with anesthetic  medicines.  Any blood disorders you have.  Any surgeries you have had.  Any medical conditions you have.  Whether you are pregnant or may be pregnant. What are the risks? Generally, this is a safe procedure. However, problems may occur, including:  Swelling, bleeding, or bruising.  Infection.  Blood clots.  Damage to other structures or organs, such as nerves, blood vessels, or the heart.  Allergic reactions to medicines used during the procedure.  What happens before the procedure? Staying hydrated Follow instructions from your health care provider about hydration, which may include:  Up to 2 hours before the procedure - you may continue to drink clear liquids, such as water, clear fruit juice, black coffee, and plain tea.  Eating and drinking restrictions Follow instructions from your health care provider about eating and drinking, which may include:  8 hours before the procedure - stop eating heavy meals or foods such as meat, fried foods, or fatty foods.  6 hours before the procedure - stop eating light meals or foods, such as toast or cereal.  6 hours before the procedure - stop drinking milk or drinks that contain milk.  2 hours before the procedure - stop drinking clear liquids.  Medicine Ask your health care provider about:  Changing or stopping your normal medicines. This is important if you take diabetes medicines or blood thinners.  Taking medicines such as aspirin and ibuprofen. These medicines can thin your blood. Do not take these medicines before your procedure if your doctor tells you not to.  Tests  You may have blood tests.  You may have a test to check the electrical signals in your heart (electrocardiogram,  ECG).  You may have imaging tests, such as a chest X-ray. General instructions  For 24 hours before the procedure, stop using products that contain nicotine or tobacco, such as cigarettes and e-cigarettes. If you need help quitting, ask  your health care provider.  Plan to have someone take you home from the hospital or clinic.  You may be asked to shower with a germ-killing soap. What happens during the procedure?  To reduce your risk of infection: ? Your health care team will wash or sanitize their hands. ? Your skin will be washed with soap. ? Hair may be removed from the surgical area.  Small monitors will be put on your body. They will be used to check your heart, blood pressure, and oxygen level.  An IV tube will be inserted into one of your veins.  You will be given one or more of the following: ? A medicine to help you relax (sedative). ? A medicine to numb the area (local anesthetic). ? A medicine to make you fall asleep (general anesthetic).  Leads will be guided through a blood vessel into your heart and attached to your heart muscles. Depending on the ICD, the leads may go into one ventricle or they may go into both ventricles and into an upper chamber of the heart. An X-ray machine (fluoroscope) will be usedto help guide the leads.  A small incision will be made to create a deep pocket under your skin.  The pulse generator will be placed into the pocket.  The ICD will be tested.  The incision will be closed with stitches (sutures), skin glue, or staples.  A bandage (dressing) will be placed over the incision. This procedure may vary among health care providers and hospitals. What happens after the procedure?  Your blood pressure, heart rate, breathing rate, and blood oxygen level will be monitored often until the medicines you were given have worn off.  A chest X-ray will be taken to check that the ICD is in the right place.  You will need to stay in the hospital for 1-2 days so your health care provider can make sure your ICD is working.  Do not drive for 24 hours if you received a sedative. Ask your health care provider when it is safe for you to drive.  You may be given an identification  card explaining that you have an ICD. Summary  An implantable cardioverter defibrillator (ICD) is a small device that is placed under the skin in the chest or abdomen. It is used to detect and correct dangerous irregular heartbeats (arrhythmias).  An ICD consists of a battery, a small computer (pulse generator), and wires (leads) that go into the heart.  When an ICD detects rapid heart rhythm (tachycardia), it sends a low-energy shock to the heart to restore the heartbeat to normal (cardioversion). If cardioversion does not work or if the ICD detects uncoordinated heart contractions (fibrillation), it delivers a high-energy shock to the heart (defibrillation) to restart the heart.  You will need to stay in the hospital for 1-2 days to make sure your ICD is working. This information is not intended to replace advice given to you by your health care provider. Make sure you discuss any questions you have with your health care provider. Document Released: 12/07/2001 Document Revised: 03/26/2016 Document Reviewed: 03/26/2016 Elsevier Interactive Patient Education  2017 [employer].

## 2017-06-24 ENCOUNTER — Encounter (HOSPITAL_COMMUNITY): Payer: Self-pay | Admitting: General Practice

## 2017-06-24 ENCOUNTER — Ambulatory Visit (HOSPITAL_COMMUNITY)
Admission: RE | Admit: 2017-06-24 | Discharge: 2017-06-25 | Disposition: A | Discharge status: Home / Self Care | Payer: BLUE CROSS/BLUE SHIELD | Source: Ambulatory Visit | Attending: Cardiovascular Disease | Admitting: Cardiovascular Disease

## 2017-06-24 ENCOUNTER — Encounter (HOSPITAL_COMMUNITY)
Admission: RE | Disposition: A | Discharge status: Home / Self Care | Payer: Self-pay | Source: Ambulatory Visit | Attending: Cardiovascular Disease

## 2017-06-24 ENCOUNTER — Other Ambulatory Visit: Payer: Self-pay

## 2017-06-24 DIAGNOSIS — R001 Bradycardia, unspecified: Secondary | ICD-10-CM

## 2017-06-24 DIAGNOSIS — I255 Ischemic cardiomyopathy: Secondary | ICD-10-CM | POA: Diagnosis not present

## 2017-06-24 DIAGNOSIS — I2510 Atherosclerotic heart disease of native coronary artery without angina pectoris: Secondary | ICD-10-CM | POA: Diagnosis present

## 2017-06-24 DIAGNOSIS — Z6832 Body mass index (BMI) 32.0-32.9, adult: Secondary | ICD-10-CM | POA: Insufficient documentation

## 2017-06-24 DIAGNOSIS — I110 Hypertensive heart disease with heart failure: Secondary | ICD-10-CM | POA: Diagnosis not present

## 2017-06-24 DIAGNOSIS — I739 Peripheral vascular disease, unspecified: Secondary | ICD-10-CM | POA: Diagnosis not present

## 2017-06-24 DIAGNOSIS — Z7982 Long term (current) use of aspirin: Secondary | ICD-10-CM | POA: Diagnosis not present

## 2017-06-24 DIAGNOSIS — E669 Obesity, unspecified: Secondary | ICD-10-CM | POA: Diagnosis not present

## 2017-06-24 DIAGNOSIS — I493 Ventricular premature depolarization: Secondary | ICD-10-CM | POA: Insufficient documentation

## 2017-06-24 DIAGNOSIS — Z951 Presence of aortocoronary bypass graft: Secondary | ICD-10-CM | POA: Diagnosis not present

## 2017-06-24 DIAGNOSIS — R7303 Prediabetes: Secondary | ICD-10-CM | POA: Diagnosis not present

## 2017-06-24 DIAGNOSIS — Z79899 Other long term (current) drug therapy: Secondary | ICD-10-CM | POA: Diagnosis not present

## 2017-06-24 DIAGNOSIS — Z9189 Other specified personal risk factors, not elsewhere classified: Secondary | ICD-10-CM

## 2017-06-24 DIAGNOSIS — I5042 Chronic combined systolic (congestive) and diastolic (congestive) heart failure: Secondary | ICD-10-CM | POA: Diagnosis not present

## 2017-06-24 DIAGNOSIS — I5022 Chronic systolic (congestive) heart failure: Secondary | ICD-10-CM | POA: Diagnosis present

## 2017-06-24 DIAGNOSIS — Z885 Allergy status to narcotic agent status: Secondary | ICD-10-CM | POA: Insufficient documentation

## 2017-06-24 DIAGNOSIS — Z87891 Personal history of nicotine dependence: Secondary | ICD-10-CM | POA: Insufficient documentation

## 2017-06-24 DIAGNOSIS — E7800 Pure hypercholesterolemia, unspecified: Secondary | ICD-10-CM | POA: Diagnosis not present

## 2017-06-24 DIAGNOSIS — Z8674 Personal history of sudden cardiac arrest: Secondary | ICD-10-CM | POA: Insufficient documentation

## 2017-06-24 DIAGNOSIS — T50905A Adverse effect of unspecified drugs, medicaments and biological substances, initial encounter: Secondary | ICD-10-CM

## 2017-06-24 DIAGNOSIS — Z006 Encounter for examination for normal comparison and control in clinical research program: Secondary | ICD-10-CM | POA: Insufficient documentation

## 2017-06-24 DIAGNOSIS — Z95810 Presence of automatic (implantable) cardiac defibrillator: Secondary | ICD-10-CM

## 2017-06-24 HISTORY — DX: Presence of automatic (implantable) cardiac defibrillator: Z95.810

## 2017-06-24 HISTORY — PX: ICD IMPLANT: EP1208

## 2017-06-24 HISTORY — DX: Heart failure, unspecified: I50.9

## 2017-06-24 HISTORY — DX: Acute myocardial infarction, unspecified: I21.9

## 2017-06-24 HISTORY — PX: CARDIAC DEFIBRILLATOR PLACEMENT: SHX171

## 2017-06-24 HISTORY — DX: Unspecified osteoarthritis, unspecified site: M19.90

## 2017-06-24 LAB — CBC
HCT: 45.2 % (ref 39.0–52.0)
Hemoglobin: 15.7 g/dL (ref 13.0–17.0)
MCH: 31.2 pg (ref 26.0–34.0)
MCHC: 34.7 g/dL (ref 30.0–36.0)
MCV: 89.7 fL (ref 78.0–100.0)
PLATELETS: 141 10*3/uL — AB (ref 150–400)
RBC: 5.04 MIL/uL (ref 4.22–5.81)
RDW: 13.7 % (ref 11.5–15.5)
WBC: 6 10*3/uL (ref 4.0–10.5)

## 2017-06-24 LAB — BASIC METABOLIC PANEL
Anion gap: 14 (ref 5–15)
BUN: 16 mg/dL (ref 6–20)
CO2: 19 mmol/L — ABNORMAL LOW (ref 22–32)
CREATININE: 0.84 mg/dL (ref 0.61–1.24)
Calcium: 9.5 mg/dL (ref 8.9–10.3)
Chloride: 107 mmol/L (ref 101–111)
GFR calc Af Amer: >60 mL/min (ref >60)
GLUCOSE: 79 mg/dL (ref 65–99)
Potassium: 4.3 mmol/L (ref 3.5–5.1)
SODIUM: 140 mmol/L (ref 135–145)

## 2017-06-24 LAB — SURGICAL PCR SCREEN
MRSA, PCR: NEGATIVE
Staphylococcus aureus: NEGATIVE

## 2017-06-24 SURGERY — ICD IMPLANT

## 2017-06-24 MED ORDER — SIMVASTATIN 20 MG PO TABS
40.0000 mg | ORAL_TABLET | Freq: Every day | ORAL | Status: DC
  Administered 2017-06-24: 40 mg via ORAL
  Filled 2017-06-24: qty 2

## 2017-06-24 MED ORDER — METOPROLOL TARTRATE 12.5 MG HALF TABLET
12.5000 mg | ORAL_TABLET | Freq: Two times a day (BID) | ORAL | Status: DC
  Administered 2017-06-24 – 2017-06-25 (×2): 12.5 mg via ORAL
  Filled 2017-06-24 (×2): qty 1

## 2017-06-24 MED ORDER — LIDOCAINE HCL (PF) 1 % IJ SOLN
INTRAMUSCULAR | Status: DC | PRN
  Administered 2017-06-24: 40 mL

## 2017-06-24 MED ORDER — MUPIROCIN 2 % EX OINT
1.0000 "application " | TOPICAL_OINTMENT | Freq: Once | CUTANEOUS | Status: AC
  Administered 2017-06-24: 1 via TOPICAL

## 2017-06-24 MED ORDER — ASPIRIN EC 81 MG PO TBEC
81.0000 mg | DELAYED_RELEASE_TABLET | Freq: Every day | ORAL | Status: DC
  Administered 2017-06-25: 81 mg via ORAL
  Filled 2017-06-24: qty 1

## 2017-06-24 MED ORDER — ONDANSETRON HCL 4 MG/2ML IJ SOLN: 4.0000 mg | Freq: Four times a day (QID) | INTRAMUSCULAR | Status: DC | PRN

## 2017-06-24 MED ORDER — SODIUM CHLORIDE 0.9 % IV SOLN
INTRAVENOUS | Status: DC
  Administered 2017-06-24: 15:00:00 via INTRAVENOUS

## 2017-06-24 MED ORDER — HEPARIN (PORCINE) IN NACL 2-0.9 UNIT/ML-% IJ SOLN
INTRAMUSCULAR | Status: AC | PRN
  Administered 2017-06-24: 500 mL

## 2017-06-24 MED ORDER — CEFAZOLIN SODIUM-DEXTROSE 1-4 GM/50ML-% IV SOLN
1.0000 g | Freq: Four times a day (QID) | INTRAVENOUS | Status: AC
  Administered 2017-06-24 – 2017-06-25 (×3): 1 g via INTRAVENOUS
  Filled 2017-06-24 (×3): qty 50

## 2017-06-24 MED ORDER — FENTANYL CITRATE (PF) 100 MCG/2ML IJ SOLN
INTRAMUSCULAR | Status: AC
  Filled 2017-06-24: qty 2

## 2017-06-24 MED ORDER — FENTANYL CITRATE (PF) 100 MCG/2ML IJ SOLN
INTRAMUSCULAR | Status: DC | PRN
  Administered 2017-06-24 (×4): 25 ug via INTRAVENOUS

## 2017-06-24 MED ORDER — SODIUM CHLORIDE 0.9 % IR SOLN
Status: AC
  Filled 2017-06-24: qty 2

## 2017-06-24 MED ORDER — MIDAZOLAM HCL 5 MG/5ML IJ SOLN
INTRAMUSCULAR | Status: DC | PRN
  Administered 2017-06-24: 2 mg via INTRAVENOUS
  Administered 2017-06-24 (×3): 1 mg via INTRAVENOUS

## 2017-06-24 MED ORDER — CEFAZOLIN SODIUM-DEXTROSE 2-4 GM/100ML-% IV SOLN
2.0000 g | INTRAVENOUS | Status: AC
  Administered 2017-06-24: 2 g via INTRAVENOUS

## 2017-06-24 MED ORDER — LIDOCAINE HCL (PF) 1 % IJ SOLN
INTRAMUSCULAR | Status: AC
  Filled 2017-06-24: qty 60

## 2017-06-24 MED ORDER — MUPIROCIN 2 % EX OINT
TOPICAL_OINTMENT | CUTANEOUS | Status: AC
  Administered 2017-06-24: 1 via TOPICAL
  Filled 2017-06-24: qty 22

## 2017-06-24 MED ORDER — YOU HAVE A PACEMAKER BOOK
Freq: Once | Status: AC
  Administered 2017-06-24: 21:00:00 1
  Filled 2017-06-24: qty 1

## 2017-06-24 MED ORDER — CEFAZOLIN SODIUM-DEXTROSE 2-4 GM/100ML-% IV SOLN
INTRAVENOUS | Status: AC
  Filled 2017-06-24: qty 100

## 2017-06-24 MED ORDER — MIDAZOLAM HCL 5 MG/5ML IJ SOLN
INTRAMUSCULAR | Status: AC
  Filled 2017-06-24: qty 5

## 2017-06-24 MED ORDER — SODIUM CHLORIDE 0.9 % IR SOLN
80.0000 mg | Status: AC
  Administered 2017-06-24: 80 mg

## 2017-06-24 MED ORDER — HEPARIN (PORCINE) IN NACL 2-0.9 UNIT/ML-% IJ SOLN
INTRAMUSCULAR | Status: AC
  Filled 2017-06-24: qty 500

## 2017-06-24 MED ORDER — CHLORHEXIDINE GLUCONATE 4 % EX LIQD: 60.0000 mL | Freq: Once | CUTANEOUS | Status: DC

## 2017-06-24 MED ORDER — ACETAMINOPHEN 325 MG PO TABS
325.0000 mg | ORAL_TABLET | ORAL | Status: DC | PRN
  Administered 2017-06-24 – 2017-06-25 (×3): 650 mg via ORAL
  Filled 2017-06-24 (×3): qty 2

## 2017-06-24 SURGICAL SUPPLY — 8 items
CABLE SURGICAL S-101-97-12 (CABLE) ×4 IMPLANT
ICD EVERA DR XT MRI DDMB1D4 (ICD Generator) ×2 IMPLANT
LEAD CAPSURE NOVUS 5076-52CM (Lead) ×2 IMPLANT
LEAD SPRINT QUAT SEC 6935M-62 (Lead) ×2 IMPLANT
PAD DEFIB LIFELINK (PAD) ×2 IMPLANT
SHEATH CLASSIC 7F (SHEATH) ×2 IMPLANT
SHEATH CLASSIC 9F (SHEATH) ×2 IMPLANT
TRAY PACEMAKER INSERTION (PACKS) ×2 IMPLANT

## 2017-06-24 NOTE — Interval H&P Note (Signed)
History and Physical Interval Note:  06/24/2017 1:44 PM  Darrell Farrell  has presented today for surgery, with the diagnosis of hf - primary prevention  - ischemic cmp  The various methods of treatment have been discussed with the patient and family. After consideration of risks, benefits and other options for treatment, the patient has consented to  Procedure(s): ICD IMPLANT (N/A) as a surgical intervention .  The patient's history has been reviewed, patient examined, no change in status, stable for surgery.  I have reviewed the patient's chart and labs.  Questions were answered to the patient's satisfaction.     Darrell Farrell

## 2017-06-24 NOTE — Progress Notes (Signed)
ICD Criteria  Current LVEF:25%. Within 12 months prior to implant: Yes   Heart failure history: Yes, Class II  Cardiomyopathy history: Yes, Ischemic Cardiomyopathy - Prior MI.  Atrial Fibrillation/Atrial Flutter: No.  Ventricular tachycardia history: No.  Cardiac arrest history: No.  History of syndromes with risk of sudden death: No.  Previous ICD: No.  Current ICD indication: Primary  PPM indication: Yes. Pacing type: Atrial. Less than 40% RV pacing requirement anticipated. Indication: Sick Sinus Syndrome   Class I or II Bradycardia indication present: Yes  Beta Blocker therapy for 3 or more months: No, medical reason. Bradycardia  Ace Inhibitor/ARB therapy for 3 or more months: No, medical reason. Symptomatic hypotension.

## 2017-06-24 NOTE — Op Note (Signed)
Procedure report  Procedure performed: 1. Implantation of new dual chamber cardioverter defibrillator 2. Fluoroscopy 3. Moderate sedation 4. Initial lead and generator testing  Reason for procedure: Primary prevention of sudden cardiac death: ischemic cardiomyopathy, left ventricular ejection fraction less than 30%, Heart failure NYHA class 2, on maximum tolerated medical therapy for over 90 days (MADIT-II) Drug induced bradycardia  Procedure performed by: Sanda Klein, MD  Complications: None  Estimated blood loss: <10 mL  Medications administered during procedure: Ancef 2 g intravenously Lidocaine 1% 30 mL locally Fentanyl 100 mcg intravenously Versed 5 mg intravenously  During this procedure the patient is administered a total of Versed 5 mg and Fentanyl 100 mcg to achieve and maintain moderate conscious sedation.  The patient's heart rate, blood pressure, and oxygen saturation are monitored continuously during the procedure. The period of conscious sedation is 59 minutes, of which I was present face-to-face 100% of this time.   Device details:  Generator Medtronic [place] DR model S1111870 serial number [id number] H Right atrial lead Medtronic [id number] serial number [id number] Right ventricular lead Medtronic [id number] serial number [id number]  Procedure details:  After the risks and benefits of the procedure were discussed the patient provided informed consent and was brought to the cardiac cath lab in the fasting state. The patient was prepped and draped in usual sterile fashion. Local anesthesia with 1% lidocaine was administered to to the left infraclavicular area. A 5-6 cm horizontal incision was made parallel with and 2-3 cm caudal to the left clavicle. Using electrocautery and blunt dissection a prepectoral pocket was created down to the level of the pectoralis major muscle fascia. The pocket was carefully inspected for hemostasis. An antibiotic-soaked sponge was placed  in the pocket.  Under fluoroscopic guidance and using the modified Seldinger technique 2 separate venipunctures were performed to access the left subclavian vein. No difficulty was encountered accessing the vein.  Two J-tip guidewires were subsequently exchanged for a 9.5 and 7 French safe sheaths.  Under fluoroscopic guidance the ventricular lead was advanced to level of the mid to apical right ventricular septum and thet active-fixation helix was deployed. Prominent current of injury was seen. Satisfactory pacing and sensing parameters were recorded. There was no evidence of diaphragmatic stimulation at maximum device output. The safe sheath was peeled away and the lead was secured in place with 2-0 silk.  In similar fashion the right atrial lead was advanced to the level of the atrial appendage. The active-fixation helix was deployed. There was prominent current of injury. Satisfactory  pacing and sensing parameters were recorded. There was no evidence of diaphragmatic stimulation with pacing at maximum device output. The safe sheath was peeled away and the lead was secured in place with 2-0 silk.  The antibiotic-soaked sponge was removed from the pocket. The pocket was flushed with copious amounts of antibiotic solution. Reinspection showed excellent hemostasis.  The ventricular lead was connected to the generator and appropriate ventricular pacing was seen. Subsequently the atrial lead was also connected. Repeat testing of the lead parameters later showed excellent values.  The entire system was then carefully inserted in the pocket with care been taking that the leads and device assumed a comfortable position without pressure on the incision. Great care was taken that the leads be located deep to the generator. The pocket was then closed in layers using 2 layers of 2-0 Vicryl and cutaneous staples, after which a sterile dressing was applied.  Defibrillation threshold testing was not performed. At  the end  of the procedure the following lead parameters were encountered:  Right atrial lead  sensed P waves 2.6 mV, impedance 475 ohms, threshold 0.5 V at 0.4 ms pulse width.  Right ventricular lead sensed R waves >20 mV, impedance 456 ohms, threshold 0.75 V at 0.4 ms pulse width.  High voltage impedance 64 ohm.  Sanda Klein, MD, [facility] [phone] office [phone] pager

## 2017-06-25 ENCOUNTER — Encounter (HOSPITAL_COMMUNITY): Payer: Self-pay | Admitting: Cardiovascular Disease

## 2017-06-25 ENCOUNTER — Other Ambulatory Visit (INDEPENDENT_AMBULATORY_CARE_PROVIDER_SITE_OTHER): Payer: Self-pay | Admitting: Orthopaedic Surgery

## 2017-06-25 ENCOUNTER — Ambulatory Visit (HOSPITAL_COMMUNITY): Payer: BLUE CROSS/BLUE SHIELD

## 2017-06-25 ENCOUNTER — Telehealth: Payer: Self-pay | Admitting: Cardiovascular Disease

## 2017-06-25 ENCOUNTER — Telehealth (INDEPENDENT_AMBULATORY_CARE_PROVIDER_SITE_OTHER): Payer: Self-pay

## 2017-06-25 DIAGNOSIS — I110 Hypertensive heart disease with heart failure: Secondary | ICD-10-CM | POA: Diagnosis not present

## 2017-06-25 DIAGNOSIS — I255 Ischemic cardiomyopathy: Secondary | ICD-10-CM | POA: Diagnosis not present

## 2017-06-25 DIAGNOSIS — I2510 Atherosclerotic heart disease of native coronary artery without angina pectoris: Secondary | ICD-10-CM | POA: Diagnosis not present

## 2017-06-25 DIAGNOSIS — Z006 Encounter for examination for normal comparison and control in clinical research program: Secondary | ICD-10-CM | POA: Diagnosis not present

## 2017-06-25 MED ORDER — METOPROLOL TARTRATE 25 MG PO TABS: 12.5000 mg | ORAL_TABLET | Freq: Two times a day (BID) | ORAL | 5 refills | Status: DC

## 2017-06-25 NOTE — Progress Notes (Signed)
Discharge Summary    Patient ID: Darrell Farrell,  MRN: [id number], DOB/AGE: 1951-03-21 67 y.o.  Admit date: 06/24/2017 Discharge date: 06/25/2017  Primary Care Provider: [facility] Cardiologist: No primary care provider on file.  Discharge Diagnoses    Principal Problem:   Chronic systolic congestive heart failure (HCC) Active Problems:   CAD S/P cabg 2009 (lima-lad, svgx3 - dx, lcx, pda)   At risk for sudden cardiac death   Drug-induced bradycardia   ICD (implantable cardioverter-defibrillator) in place   Allergies Allergies  Allergen Reactions  . Morphine And Related Other (See Comments)    Local swelling at injection site  . Simvastatin Other (See Comments)    Myalgias/dry mouth    Diagnostic Studies/Procedures    ICD implant 06/24/2017 Device details:  Generator Medtronic [place] DR model S1111870 serial number [id number] H Right atrial lead Medtronic [id number] serial number [id number] Right ventricular lead Medtronic [id number] serial number [id number] V _____________   History of Present Illness     Darrell Farrell is a 67 y.o. male with CAD s/p CABG 2009, PAD (common iliac artery aneurysms, Right popliteal artery aneurysm, followed at [facility]), HTN, ischemic cardiomyopathy (LVH, apical hypokinesis, EF 35-45% by echo). He had severe trauma with right leg osteomyelitis and multiple surgeries, including one complicated by cardiac arrest, in the 1970s.  His left radial artery has been harvested for a skin and bone graft to the area of osteomyelitis in 2013.  He is here to follow-up on his cardiac catheterization.  The study was performed for deteriorating left ventricular systolic function.  By left ventriculography his EF was estimated to be 20-25%.  His native coronary arteries are essentially occluded, but he has patent grafts (LIMA to LAD, SVG to PDA, SVG to OM, SVG to diagonal).  LVEDP was only slightly elevated at 17-19  mmHg.  Over the last couple of years he has gradually come off all the medications for cardiomyopathy due to complaints of weakness related to bradycardia and hypotension.  While is debatable whether he really was bradycardic (had extremely frequent PVCs leading to effective bradycardia), he feels better from a symptomatic point of view of these medicines then on them.  He is free of angina (previously manifesting as mandibular pain) and dyspnea at rest or with activity and does not require diuretic therapy..  We discussed the need to resume treatment for cardiomyopathy.  It is likely he will be better tolerant of beta-blocker therapy if he has a "backup" pacemaker rhythm for bradycardia.  More importantly, he qualifies for primary prevention implantation of a defibrillator to reduce the risk of sudden cardiac death (MADIT-2).  He should get a dual-chamber device to allow [place] to resume beta-blocker therapy, which has been limited by bradycardia.  Hospital Course     Patient presents on 06/24/2017 for scheduled ICD implantation for primary prevention.  He underwent dual-chamber Medtronic ICD implantation by Dr. Sallyanne Kuster.  Postprocedure, he recovered well.  Morning chest x-ray obtained on 06/25/2017 showed good lead placement, no pneumothorax.  Device interrogation showed a good lead parameters.  He continued to have frequent PVCs, couplet and a short SVT bursts.  Metoprolol was restarted.  I will arrange wound clinic follow-up in 10-14 days and office visit with Dr. Sallyanne Kuster in 59-month.  Dr. Sallyanne Kuster has already discussed with the patient post care instruction and shock plan.  _____________  Discharge Vitals Blood pressure (!) 106/52, pulse 75, temperature 97.6 F (36.4 C), temperature source Oral, resp.  rate 14, height 5\' 11"  (1.803 m), weight 233 lb 4 oz (105.8 kg), SpO2 95 %.  Filed Weights   06/24/17 1345 06/25/17 0125  Weight: 232 lb (105.2 kg) 233 lb 4 oz (105.8 kg)    Labs & Radiologic  Studies    CBC Recent Labs    06/24/17 1359  WBC 6.0  HGB 15.7  HCT 45.2  MCV 89.7  PLT 568*   Basic Metabolic Panel Recent Labs    06/24/17 1359  NA 140  K 4.3  CL 107  CO2 19*  GLUCOSE 79  BUN 16  CREATININE 0.84  CALCIUM 9.5   _____________  Dg Chest 2 View  Result Date: 06/25/2017 CLINICAL DATA:  ICD placement EXAM: CHEST - 2 VIEW COMPARISON:  03/20/2008 FINDINGS: Cardiomegaly and CABG changes again noted. A LEFT-sided ICD is noted with leads overlying the RIGHT atrium and RIGHT ventricle on this frontal view. There is no evidence of pneumothorax. The lungs are clear.  No pleural effusion. IMPRESSION: LEFT ICD placement. No evidence of pneumothorax or pleural effusion. Cardiomegaly/CABG changes again noted. Electronically Signed   By: Margarette Canada M.D.   On: 06/25/2017 08:44   Disposition   Pt is being discharged home today in good condition.  Follow-up Plans & Appointments    Follow-up Information    Croitoru, Mihai, MD Follow up on 07/14/2017.   Specialty:  Cardiology Why:  10:00AM. Wound check Contact information: [address] [place] [address] [phone]            Discharge Medications   Allergies as of 06/25/2017      Reactions   Morphine And Related Other (See Comments)   Local swelling at injection site   Simvastatin Other (See Comments)   Myalgias/dry mouth      Medication List    TAKE these medications   aspirin EC 81 MG tablet Take 1 tablet (81 mg total) by mouth daily.   CO Q 10 PO Take 1 capsule by mouth daily.   KRILL OIL PO Take 1 capsule by mouth daily.   metoprolol tartrate 25 MG tablet Commonly known as:  LOPRESSOR Take 0.5 tablets (12.5 mg total) by mouth 2 (two) times daily.   omeprazole 20 MG capsule Commonly known as:  PRILOSEC Take 20 mg by mouth daily.   PRESERVISION AREDS PO Take 1 capsule by mouth daily.   simvastatin 40 MG tablet Commonly known as:  ZOCOR TAKE ONE TABLET BY MOUTH  AT BEDTIME What changed:    how much to take  how to take this  when to take this         Outstanding Labs/Studies   None  Duration of Discharge Encounter   Greater than 30 minutes including physician time.  Signed, Almyra Deforest PA-C 06/25/2017, 10:40 AM

## 2017-06-25 NOTE — Telephone Encounter (Signed)
New message    Patient just had ICD installed and is requesting something for pain.

## 2017-06-25 NOTE — Progress Notes (Signed)
Mildly sore at surgical site, slight oozing on dressing. No hematoma. CXR shows good lead placement, no pneumothorax. Good lead parameters. Continues to have frquent PVCs, couplets, short NSVT bursts. Beta blocker restarted. Plan to uptitrate dose over the next few weeks.  "Shock plan" discussed. Wound care and activity restrictions reviewed. DC home. Wound check appointment 10-14 days. Office visit 3 months.  Sanda Klein, MD, [facility] [phone] office [phone] pager

## 2017-06-25 NOTE — Telephone Encounter (Signed)
Faxed completed PA form to [phone].

## 2017-06-25 NOTE — Telephone Encounter (Addendum)
Spoke to patient. Patient states he has not  Been discharge from hospital yet but he would like something called in for the pain. He states the extra strength Tylenol is not helping. RN  INSTRCUTRED patient to notify the nurse / provider there at the hospital. Will defer to Dr C. To see if anything else can be called in after discharge. Patient verbalized understanding

## 2017-06-25 NOTE — Telephone Encounter (Signed)
This encounter may be in reference to an earlier message where patient is requesting pain medication. There is nothing in epic referencing a Rx  Routed to MD/CMA

## 2017-06-25 NOTE — Discharge Instructions (Signed)
Supplemental Discharge Instructions for  Pacemaker/Defibrillator Patients  Activity Do not raise your left/right arm above shoulder level or extend it backward beyond shoulder level for 2 weeks. Wear the arm sling as a reminder or as needed for comfort for 2 weeks. No heavy lifting or vigorous activity with your left/right arm for 6-8 weeks.    Activity No heavy lifting or vigorous activity with your left/right arm for 6 to 8 weeks.  Do not raise your left/right arm above your head for one week.  Gradually raise your affected arm as drawn below.           __4/10                      4/11                           4/12                       4/13   NO DRIVING is preferable for 2 weeks; If absolutely necessary, drive only short, familiar routes. DO wear your seatbelt, even if it crosses over the pacemaker site.  WOUND CARE - Keep the wound area clean and dry.  Remove the dressing the day after you return home (usually 48 hours after the procedure). - DO NOT SUBMERGE UNDER WATER UNTIL FULLY HEALED (no tub baths, hot tubs, swimming pools, etc.).  - You  may shower or take a sponge bath after the dressing is removed. DO NOT SOAK the area and do not allow the shower to directly spray on the site. - If you have staples, these will be removed in the office in 7-14 days. - If you have tape/steri-strips on your wound, these will fall off; do not pull them off prematurely.   - No bandage is needed on the site.  DO  NOT apply any creams, oils, or ointments to the wound area. - If you notice any drainage or discharge from the wound, any swelling, excessive redness or bruising at the site, or if you develop a fever > 101? F after you are discharged home, call the office at once.  Special Instructions - You are still able to use cellular telephones.  Avoid carrying your cellular phone near your device. - When traveling through airports, show security personnel your identification card to avoid being  screened in the metal detectors.  - Avoid arc welding equipment, MRI testing (magnetic resonance imaging), TENS units (transcutaneous nerve stimulators).  Call the office for questions about other devices. - Avoid electrical appliances that are in poor condition or are not properly grounded. - Microwave ovens are safe to be near or to operate.  Additional information for defibrillator patients should your device go off: - If your device goes off ONCE and you feel fine afterward, notify the clinic at [phone]. - If your device goes off ONCE and you do not feel well afterward, call 911. - If your device goes off TWICE or more in one day, call 911.  DO NOT DRIVE YOURSELF OR A FAMILY MEMBER WITH A DEFIBRILLATOR TO THE HOSPITAL--CALL 911.

## 2017-06-25 NOTE — Telephone Encounter (Signed)
Pt c/o medication issue:  1. Name of Medication: Hydrocodone   2. How are you currently taking this medication (dosage and times per day)? 5-300mg   1 every 8 hours   3. Are you having a reaction (difficulty breathing--STAT)? no 4. What is your medication issue? There is no date and by law she has to have a date

## 2017-06-26 ENCOUNTER — Telehealth (INDEPENDENT_AMBULATORY_CARE_PROVIDER_SITE_OTHER): Payer: Self-pay

## 2017-06-26 NOTE — Telephone Encounter (Signed)
I have called the pharmacy, confirmed starting time was yesterday

## 2017-06-26 NOTE — Telephone Encounter (Signed)
It looks like the Rx was issued without a date on it? Darrell Farrell was going to take care of it. He had ICD on Wednesday, needs about 10 tabs, please. MCr

## 2017-06-26 NOTE — Telephone Encounter (Signed)
Follow up    Loma Sousa from [facility] calling [phone], states patient picked up medication on 06/25/2017. She just wants to know the start date of medication in the event of an audit. Please call

## 2017-06-26 NOTE — Telephone Encounter (Signed)
Patient called and left VM concerning status of SynviscOne injection.  Advised patient that medication required authorization, currently still pending.  Patient stated that his [employer] insurance will end on Sunday and he will have Medicare.  Will call patient back once authorization has been approved.

## 2017-06-29 ENCOUNTER — Telehealth (INDEPENDENT_AMBULATORY_CARE_PROVIDER_SITE_OTHER): Payer: Self-pay

## 2017-06-29 ENCOUNTER — Encounter (HOSPITAL_COMMUNITY): Payer: Self-pay | Admitting: Cardiovascular Disease

## 2017-06-29 NOTE — Telephone Encounter (Signed)
Talked with patient and advised him that authorization has been approved by [facility].  SynviscOne, Right Knee, Buy & Bill Appt. Scheduled 07/03/2017

## 2017-07-03 ENCOUNTER — Ambulatory Visit (INDEPENDENT_AMBULATORY_CARE_PROVIDER_SITE_OTHER): Payer: BLUE CROSS/BLUE SHIELD | Admitting: Orthopaedic Surgery

## 2017-07-07 ENCOUNTER — Ambulatory Visit (INDEPENDENT_AMBULATORY_CARE_PROVIDER_SITE_OTHER): Payer: Medicare Other | Admitting: *Deleted

## 2017-07-07 ENCOUNTER — Encounter (INDEPENDENT_AMBULATORY_CARE_PROVIDER_SITE_OTHER): Payer: Self-pay | Admitting: Orthopaedic Surgery

## 2017-07-07 ENCOUNTER — Ambulatory Visit (INDEPENDENT_AMBULATORY_CARE_PROVIDER_SITE_OTHER): Payer: Medicare Other | Admitting: Orthopaedic Surgery

## 2017-07-07 DIAGNOSIS — Z95810 Presence of automatic (implantable) cardiac defibrillator: Secondary | ICD-10-CM | POA: Diagnosis not present

## 2017-07-07 DIAGNOSIS — I5022 Chronic systolic (congestive) heart failure: Secondary | ICD-10-CM

## 2017-07-07 DIAGNOSIS — I255 Ischemic cardiomyopathy: Secondary | ICD-10-CM

## 2017-07-07 DIAGNOSIS — M1711 Unilateral primary osteoarthritis, right knee: Secondary | ICD-10-CM | POA: Diagnosis not present

## 2017-07-07 LAB — CUP PACEART INCLINIC DEVICE CHECK
Battery Remaining Longevity: 124 mo
Brady Statistic AP VP Percent: 0.07 %
Brady Statistic AP VS Percent: 20.07 %
Brady Statistic AS VS Percent: 79.76 %
HighPow Impedance: 59 Ohm
Implantable Lead Implant Date: 20190327
Implantable Lead Location: 753859
Implantable Lead Location: 753860
Lead Channel Impedance Value: 342 Ohm
Lead Channel Pacing Threshold Amplitude: 0.75 V
Lead Channel Pacing Threshold Pulse Width: 0.4 ms
Lead Channel Sensing Intrinsic Amplitude: 4.625 mV
Lead Channel Setting Pacing Amplitude: 3.5 V
Lead Channel Setting Pacing Amplitude: 3.5 V
Lead Channel Setting Pacing Pulse Width: 0.4 ms
MDC IDC LEAD IMPLANT DT: 20190327
MDC IDC MSMT BATTERY VOLTAGE: 3.01 V
MDC IDC MSMT LEADCHNL RA IMPEDANCE VALUE: 513 Ohm
MDC IDC MSMT LEADCHNL RA PACING THRESHOLD AMPLITUDE: 0.75 V
MDC IDC MSMT LEADCHNL RA PACING THRESHOLD PULSEWIDTH: 0.4 ms
MDC IDC MSMT LEADCHNL RV IMPEDANCE VALUE: 418 Ohm
MDC IDC MSMT LEADCHNL RV SENSING INTR AMPL: 31.625 mV
MDC IDC PG IMPLANT DT: 20190327
MDC IDC SESS DTM: 20190409123234
MDC IDC SET LEADCHNL RV SENSING SENSITIVITY: 0.3 mV
MDC IDC STAT BRADY AS VP PERCENT: 0.1 %
MDC IDC STAT BRADY RA PERCENT PACED: 18.41 %
MDC IDC STAT BRADY RV PERCENT PACED: 0.16 %

## 2017-07-07 MED ORDER — HYLAN G-F 20 48 MG/6ML IX SOSY
48.0000 mg | PREFILLED_SYRINGE | INTRA_ARTICULAR | Status: AC | PRN
  Administered 2017-07-07: 48 mg via INTRA_ARTICULAR

## 2017-07-07 NOTE — Progress Notes (Signed)
   Procedure Note  Patient: Darrell Farrell             Date of Birth: May 12, 1950           MRN: [id number]             Visit Date: 07/07/2017  Procedures: Visit Diagnoses: Primary osteoarthritis of right knee  Large Joint Inj: R knee on 07/07/2017 2:13 PM Indications: pain Details: 22 G needle  Arthrogram: No  Medications: 48 mg Hylan 48 MG/6ML Outcome: tolerated well, no immediate complications Patient was prepped and draped in the usual sterile fashion.

## 2017-07-07 NOTE — Progress Notes (Signed)
Wound check appointment. Staples removed. Wound without redness or edema. Incision edges approximated, wound well healed. Normal device function. Thresholds, sensing, and impedances consistent with implant measurements. Device programmed at 3.5V with autocapture on for extra safety margin until 3 month visit. Histogram distribution appropriate for patient and level of activity. No mode switches or ventricular arrhythmias noted. Patient educated about wound care, arm mobility, lifting restrictions, shock plan, and Carelink monitor. ROV with Hardy in 3 months.

## 2017-07-14 ENCOUNTER — Ambulatory Visit (INDEPENDENT_AMBULATORY_CARE_PROVIDER_SITE_OTHER): Payer: Medicare Other | Admitting: Cardiovascular Disease

## 2017-07-14 ENCOUNTER — Encounter: Payer: Self-pay | Admitting: Cardiovascular Disease

## 2017-07-14 VITALS — BP 110/60 | HR 71 | Ht 71.0 in | Wt 236.0 lb

## 2017-07-14 DIAGNOSIS — I255 Ischemic cardiomyopathy: Secondary | ICD-10-CM

## 2017-07-14 DIAGNOSIS — I25810 Atherosclerosis of coronary artery bypass graft(s) without angina pectoris: Secondary | ICD-10-CM | POA: Diagnosis not present

## 2017-07-14 DIAGNOSIS — I5042 Chronic combined systolic (congestive) and diastolic (congestive) heart failure: Secondary | ICD-10-CM | POA: Diagnosis not present

## 2017-07-14 DIAGNOSIS — I739 Peripheral vascular disease, unspecified: Secondary | ICD-10-CM

## 2017-07-14 DIAGNOSIS — R7303 Prediabetes: Secondary | ICD-10-CM

## 2017-07-14 DIAGNOSIS — E669 Obesity, unspecified: Secondary | ICD-10-CM | POA: Diagnosis not present

## 2017-07-14 DIAGNOSIS — H2513 Age-related nuclear cataract, bilateral: Secondary | ICD-10-CM | POA: Diagnosis not present

## 2017-07-14 DIAGNOSIS — I10 Essential (primary) hypertension: Secondary | ICD-10-CM

## 2017-07-14 DIAGNOSIS — Z95810 Presence of automatic (implantable) cardiac defibrillator: Secondary | ICD-10-CM | POA: Diagnosis not present

## 2017-07-14 DIAGNOSIS — E785 Hyperlipidemia, unspecified: Secondary | ICD-10-CM

## 2017-07-14 DIAGNOSIS — I493 Ventricular premature depolarization: Secondary | ICD-10-CM | POA: Diagnosis not present

## 2017-07-14 DIAGNOSIS — H353132 Nonexudative age-related macular degeneration, bilateral, intermediate dry stage: Secondary | ICD-10-CM | POA: Diagnosis not present

## 2017-07-14 DIAGNOSIS — D3132 Benign neoplasm of left choroid: Secondary | ICD-10-CM | POA: Diagnosis not present

## 2017-07-14 MED ORDER — METOPROLOL TARTRATE 25 MG PO TABS: 25.0000 mg | ORAL_TABLET | Freq: Two times a day (BID) | ORAL | 3 refills | Status: AC
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
## 2017-07-14 NOTE — Patient Instructions (Signed)
Dr Sallyanne Kuster has recommended making the following medication changes: 1. INCREASE Metoprolol to 25 mg twice daily  Your physician recommends that you schedule a follow-up appointment in 3-4 months with an ICD check.  If you need a refill on your cardiac medications before your next appointment, please call your pharmacy.

## 2017-07-16 ENCOUNTER — Other Ambulatory Visit (INDEPENDENT_AMBULATORY_CARE_PROVIDER_SITE_OTHER): Payer: Self-pay | Admitting: Orthopaedic Surgery

## 2017-08-02 ENCOUNTER — Other Ambulatory Visit (INDEPENDENT_AMBULATORY_CARE_PROVIDER_SITE_OTHER): Payer: Self-pay | Admitting: Physician Assistant

## 2017-08-03 NOTE — Telephone Encounter (Signed)
Sent to pharmacy 

## 2017-08-03 NOTE — Telephone Encounter (Signed)
Can you refill this, but only put for once per day?

## 2017-09-02 ENCOUNTER — Other Ambulatory Visit (INDEPENDENT_AMBULATORY_CARE_PROVIDER_SITE_OTHER): Payer: Self-pay | Admitting: Physician Assistant

## 2017-09-30 ENCOUNTER — Other Ambulatory Visit (INDEPENDENT_AMBULATORY_CARE_PROVIDER_SITE_OTHER): Payer: Self-pay | Admitting: Physician Assistant

## 2017-09-30 DIAGNOSIS — M25571 Pain in right ankle and joints of right foot: Secondary | ICD-10-CM | POA: Diagnosis not present

## 2017-10-02 DIAGNOSIS — L03115 Cellulitis of right lower limb: Secondary | ICD-10-CM | POA: Diagnosis not present

## 2017-10-02 DIAGNOSIS — M7989 Other specified soft tissue disorders: Secondary | ICD-10-CM | POA: Diagnosis not present

## 2017-10-02 DIAGNOSIS — M79604 Pain in right leg: Secondary | ICD-10-CM | POA: Diagnosis not present

## 2017-10-02 DIAGNOSIS — Z9889 Other specified postprocedural states: Secondary | ICD-10-CM | POA: Diagnosis not present

## 2017-10-02 DIAGNOSIS — E7800 Pure hypercholesterolemia, unspecified: Secondary | ICD-10-CM | POA: Diagnosis not present

## 2017-10-02 DIAGNOSIS — M25571 Pain in right ankle and joints of right foot: Secondary | ICD-10-CM | POA: Diagnosis not present

## 2017-10-02 DIAGNOSIS — Z8614 Personal history of Methicillin resistant Staphylococcus aureus infection: Secondary | ICD-10-CM | POA: Diagnosis not present

## 2017-10-02 DIAGNOSIS — Z95810 Presence of automatic (implantable) cardiac defibrillator: Secondary | ICD-10-CM | POA: Diagnosis not present

## 2017-10-02 DIAGNOSIS — I10 Essential (primary) hypertension: Secondary | ICD-10-CM | POA: Diagnosis not present

## 2017-10-04 DIAGNOSIS — Z95810 Presence of automatic (implantable) cardiac defibrillator: Secondary | ICD-10-CM | POA: Diagnosis not present

## 2017-10-04 DIAGNOSIS — I10 Essential (primary) hypertension: Secondary | ICD-10-CM | POA: Diagnosis not present

## 2017-10-04 DIAGNOSIS — Z87891 Personal history of nicotine dependence: Secondary | ICD-10-CM | POA: Diagnosis not present

## 2017-10-04 DIAGNOSIS — L03115 Cellulitis of right lower limb: Secondary | ICD-10-CM | POA: Diagnosis not present

## 2017-10-07 DIAGNOSIS — S82831K Other fracture of upper and lower end of right fibula, subsequent encounter for closed fracture with nonunion: Secondary | ICD-10-CM | POA: Diagnosis not present

## 2017-10-07 DIAGNOSIS — Y33XXXD Other specified events, undetermined intent, subsequent encounter: Secondary | ICD-10-CM | POA: Diagnosis not present

## 2017-10-07 DIAGNOSIS — M86471 Chronic osteomyelitis with draining sinus, right ankle and foot: Secondary | ICD-10-CM | POA: Diagnosis not present

## 2017-10-07 DIAGNOSIS — M25471 Effusion, right ankle: Secondary | ICD-10-CM | POA: Diagnosis not present

## 2017-10-08 ENCOUNTER — Encounter: Payer: Self-pay | Admitting: Cardiovascular Disease

## 2017-10-08 ENCOUNTER — Ambulatory Visit (INDEPENDENT_AMBULATORY_CARE_PROVIDER_SITE_OTHER): Payer: Medicare Other | Admitting: Cardiovascular Disease

## 2017-10-08 VITALS — BP 120/75 | HR 82 | Ht 71.0 in | Wt 218.0 lb

## 2017-10-08 DIAGNOSIS — I472 Ventricular tachycardia: Secondary | ICD-10-CM | POA: Insufficient documentation

## 2017-10-08 DIAGNOSIS — I4729 Other ventricular tachycardia: Secondary | ICD-10-CM | POA: Insufficient documentation

## 2017-10-08 DIAGNOSIS — I255 Ischemic cardiomyopathy: Secondary | ICD-10-CM

## 2017-10-08 DIAGNOSIS — I5022 Chronic systolic (congestive) heart failure: Secondary | ICD-10-CM

## 2017-10-08 DIAGNOSIS — I480 Paroxysmal atrial fibrillation: Secondary | ICD-10-CM | POA: Diagnosis not present

## 2017-10-08 DIAGNOSIS — Z95810 Presence of automatic (implantable) cardiac defibrillator: Secondary | ICD-10-CM

## 2017-10-08 DIAGNOSIS — Z9189 Other specified personal risk factors, not elsewhere classified: Secondary | ICD-10-CM | POA: Diagnosis not present

## 2017-10-08 DIAGNOSIS — R7303 Prediabetes: Secondary | ICD-10-CM | POA: Diagnosis not present

## 2017-10-08 DIAGNOSIS — I739 Peripheral vascular disease, unspecified: Secondary | ICD-10-CM | POA: Diagnosis not present

## 2017-10-08 DIAGNOSIS — M86661 Other chronic osteomyelitis, right tibia and fibula: Secondary | ICD-10-CM | POA: Diagnosis not present

## 2017-10-08 DIAGNOSIS — I2510 Atherosclerotic heart disease of native coronary artery without angina pectoris: Secondary | ICD-10-CM | POA: Diagnosis not present

## 2017-10-08 DIAGNOSIS — E7800 Pure hypercholesterolemia, unspecified: Secondary | ICD-10-CM

## 2017-10-08 DIAGNOSIS — E669 Obesity, unspecified: Secondary | ICD-10-CM | POA: Diagnosis not present

## 2017-10-08 MED ORDER — RIVAROXABAN 20 MG PO TABS: 20.0000 mg | ORAL_TABLET | Freq: Every day | ORAL | 5 refills | Status: DC
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
## 2017-10-08 NOTE — Patient Instructions (Signed)
Dr Sallyanne Kuster has recommended making the following medication changes: 1. START Xarelto 20 mg daily 2. STOP Aspirin 3. USE Tylenol for pain and Mobic sparingly  Remote monitoring is used to monitor your Pacemaker or ICD from home. This monitoring reduces the number of office visits required to check your device to one time per year. It allows [place] to keep an eye on the functioning of your device to ensure it is working properly. You are scheduled for a device check from home on Thursday, October 10th, 2019. You may send your transmission at any time that day. If you have a wireless device, the transmission will be sent automatically. After your physician reviews your transmission, you will receive a notification with your next transmission date.  To improve our patient care and to more adequately follow your device, [facility] has decided, as a practice, to start following each patient four times a year with your home monitor. This means that you may experience a remote appointment that is close to an in-office appointment with your physician. Your insurance will apply at the same rate as other remote monitoring transmissions.  Dr Sallyanne Kuster recommends that you schedule a follow-up appointment in 12 months with an ICD check. You will receive a reminder letter in the mail two months in advance. If you don't receive a letter, please call our office to schedule the follow-up appointment.  If you need a refill on your cardiac medications before your next appointment, please call your pharmacy.

## 2017-10-21 ENCOUNTER — Encounter: Payer: Medicare Other | Admitting: Cardiovascular Disease

## 2017-10-28 ENCOUNTER — Other Ambulatory Visit: Payer: Self-pay | Admitting: Cardiovascular Disease

## 2017-10-28 NOTE — Telephone Encounter (Signed)
Rx sent to pharmacy   

## 2017-11-03 ENCOUNTER — Other Ambulatory Visit (INDEPENDENT_AMBULATORY_CARE_PROVIDER_SITE_OTHER): Payer: Self-pay | Admitting: Physician Assistant

## 2017-11-17 ENCOUNTER — Telehealth: Payer: Self-pay | Admitting: Cardiovascular Disease

## 2017-11-17 MED ORDER — RIVAROXABAN 20 MG PO TABS: 20.0000 mg | ORAL_TABLET | Freq: Every day | ORAL | 1 refills | Status: AC

## 2017-11-17 NOTE — Telephone Encounter (Signed)
rx sent

## 2017-11-17 NOTE — Telephone Encounter (Signed)
New Message         *STAT* If patient is at the pharmacy, call can be transferred to refill team.   1. Which medications need to be refilled? (please list name of each medication and dose if known) Xarelto 20 mg  2. Which pharmacy/location (including street and city if local pharmacy) is medication to be sent to? [facility] [phone]  3. Do they need a 30 day or 90 day supply? [place]

## 2017-11-19 LAB — CUP PACEART INCLINIC DEVICE CHECK
Battery Remaining Longevity: 120 mo
Brady Statistic AP VS Percent: 35.74 %
Brady Statistic AS VP Percent: 0.15 %
Brady Statistic AS VS Percent: 63.8 %
Brady Statistic RA Percent Paced: 31.41 %
Date Time Interrogation Session: 20190711163108
HighPow Impedance: 72 Ohm
Implantable Lead Implant Date: 20190327
Implantable Lead Location: 753859
Implantable Pulse Generator Implant Date: 20190327
Lead Channel Impedance Value: 532 Ohm
Lead Channel Pacing Threshold Amplitude: 0.75 V
Lead Channel Pacing Threshold Amplitude: 0.875 V
Lead Channel Pacing Threshold Pulse Width: 0.4 ms
Lead Channel Sensing Intrinsic Amplitude: 2.875 mV
Lead Channel Sensing Intrinsic Amplitude: 2.875 mV
Lead Channel Setting Pacing Amplitude: 2.5 V
MDC IDC LEAD IMPLANT DT: 20190327
MDC IDC LEAD LOCATION: 753860
MDC IDC MSMT BATTERY VOLTAGE: 3.03 V
MDC IDC MSMT LEADCHNL RA PACING THRESHOLD PULSEWIDTH: 0.4 ms
MDC IDC MSMT LEADCHNL RV IMPEDANCE VALUE: 342 Ohm
MDC IDC MSMT LEADCHNL RV IMPEDANCE VALUE: 456 Ohm
MDC IDC MSMT LEADCHNL RV SENSING INTR AMPL: 30.375 mV
MDC IDC MSMT LEADCHNL RV SENSING INTR AMPL: 30.375 mV
MDC IDC SET LEADCHNL RA PACING AMPLITUDE: 2 V
MDC IDC SET LEADCHNL RV PACING PULSEWIDTH: 0.4 ms
MDC IDC SET LEADCHNL RV SENSING SENSITIVITY: 0.3 mV
MDC IDC STAT BRADY AP VP PERCENT: 0.31 %
MDC IDC STAT BRADY RV PERCENT PACED: 0.46 %

## 2017-12-01 ENCOUNTER — Other Ambulatory Visit (INDEPENDENT_AMBULATORY_CARE_PROVIDER_SITE_OTHER): Payer: Self-pay | Admitting: Physician Assistant

## 2017-12-09 DIAGNOSIS — I10 Essential (primary) hypertension: Secondary | ICD-10-CM | POA: Diagnosis not present

## 2017-12-09 DIAGNOSIS — I4891 Unspecified atrial fibrillation: Secondary | ICD-10-CM | POA: Diagnosis not present

## 2017-12-09 DIAGNOSIS — M2041 Other hammer toe(s) (acquired), right foot: Secondary | ICD-10-CM | POA: Diagnosis not present

## 2017-12-09 DIAGNOSIS — M159 Polyosteoarthritis, unspecified: Secondary | ICD-10-CM | POA: Diagnosis not present

## 2017-12-11 ENCOUNTER — Telehealth: Payer: Self-pay | Admitting: Cardiology

## 2017-12-11 DIAGNOSIS — Z4502 Encounter for adjustment and management of automatic implantable cardiac defibrillator: Secondary | ICD-10-CM | POA: Diagnosis not present

## 2017-12-11 DIAGNOSIS — I255 Ischemic cardiomyopathy: Secondary | ICD-10-CM | POA: Diagnosis not present

## 2017-12-11 DIAGNOSIS — I10 Essential (primary) hypertension: Secondary | ICD-10-CM | POA: Diagnosis not present

## 2017-12-11 NOTE — Telephone Encounter (Signed)
Patient verbalized agreement to release carelink profile to [facility].

## 2017-12-11 NOTE — Telephone Encounter (Signed)
LMOVM for pt to return call in regards to transferring his carelink information to [facility].

## 2017-12-14 DIAGNOSIS — Z23 Encounter for immunization: Secondary | ICD-10-CM | POA: Diagnosis not present

## 2017-12-22 DIAGNOSIS — M2041 Other hammer toe(s) (acquired), right foot: Secondary | ICD-10-CM | POA: Diagnosis not present

## 2017-12-22 DIAGNOSIS — M79674 Pain in right toe(s): Secondary | ICD-10-CM | POA: Diagnosis not present

## 2017-12-22 DIAGNOSIS — L97511 Non-pressure chronic ulcer of other part of right foot limited to breakdown of skin: Secondary | ICD-10-CM | POA: Diagnosis not present

## 2018-01-05 ENCOUNTER — Other Ambulatory Visit (INDEPENDENT_AMBULATORY_CARE_PROVIDER_SITE_OTHER): Payer: Self-pay | Admitting: Physician Assistant

## 2018-01-05 DIAGNOSIS — M2041 Other hammer toe(s) (acquired), right foot: Secondary | ICD-10-CM | POA: Diagnosis not present

## 2018-01-05 DIAGNOSIS — M79674 Pain in right toe(s): Secondary | ICD-10-CM | POA: Diagnosis not present

## 2018-01-05 DIAGNOSIS — L97511 Non-pressure chronic ulcer of other part of right foot limited to breakdown of skin: Secondary | ICD-10-CM | POA: Diagnosis not present

## 2018-01-05 NOTE — Telephone Encounter (Signed)
Ok to send in.  

## 2018-01-19 DIAGNOSIS — I779 Disorder of arteries and arterioles, unspecified: Secondary | ICD-10-CM | POA: Diagnosis not present

## 2018-01-26 DIAGNOSIS — M25571 Pain in right ankle and joints of right foot: Secondary | ICD-10-CM | POA: Diagnosis not present

## 2018-01-26 DIAGNOSIS — L97511 Non-pressure chronic ulcer of other part of right foot limited to breakdown of skin: Secondary | ICD-10-CM | POA: Diagnosis not present

## 2018-01-26 DIAGNOSIS — M79674 Pain in right toe(s): Secondary | ICD-10-CM | POA: Diagnosis not present

## 2018-01-26 DIAGNOSIS — M2041 Other hammer toe(s) (acquired), right foot: Secondary | ICD-10-CM | POA: Diagnosis not present

## 2018-01-26 DIAGNOSIS — L97312 Non-pressure chronic ulcer of right ankle with fat layer exposed: Secondary | ICD-10-CM | POA: Diagnosis not present

## 2018-02-02 DIAGNOSIS — M2041 Other hammer toe(s) (acquired), right foot: Secondary | ICD-10-CM | POA: Diagnosis not present

## 2018-02-02 DIAGNOSIS — I255 Ischemic cardiomyopathy: Secondary | ICD-10-CM | POA: Diagnosis not present

## 2018-02-02 DIAGNOSIS — L97312 Non-pressure chronic ulcer of right ankle with fat layer exposed: Secondary | ICD-10-CM | POA: Diagnosis not present

## 2018-02-02 DIAGNOSIS — L97511 Non-pressure chronic ulcer of other part of right foot limited to breakdown of skin: Secondary | ICD-10-CM | POA: Diagnosis not present

## 2018-02-02 DIAGNOSIS — M25571 Pain in right ankle and joints of right foot: Secondary | ICD-10-CM | POA: Diagnosis not present

## 2018-02-02 DIAGNOSIS — M79674 Pain in right toe(s): Secondary | ICD-10-CM | POA: Diagnosis not present

## 2018-02-03 DIAGNOSIS — I70209 Unspecified atherosclerosis of native arteries of extremities, unspecified extremity: Secondary | ICD-10-CM | POA: Diagnosis not present

## 2018-02-03 DIAGNOSIS — I70218 Atherosclerosis of native arteries of extremities with intermittent claudication, other extremity: Secondary | ICD-10-CM | POA: Diagnosis not present

## 2018-02-08 DIAGNOSIS — M2041 Other hammer toe(s) (acquired), right foot: Secondary | ICD-10-CM | POA: Diagnosis not present

## 2018-02-08 DIAGNOSIS — M79674 Pain in right toe(s): Secondary | ICD-10-CM | POA: Diagnosis not present

## 2018-02-08 DIAGNOSIS — L97511 Non-pressure chronic ulcer of other part of right foot limited to breakdown of skin: Secondary | ICD-10-CM | POA: Diagnosis not present

## 2018-02-08 DIAGNOSIS — M25571 Pain in right ankle and joints of right foot: Secondary | ICD-10-CM | POA: Diagnosis not present

## 2018-02-08 DIAGNOSIS — L97312 Non-pressure chronic ulcer of right ankle with fat layer exposed: Secondary | ICD-10-CM | POA: Diagnosis not present

## 2018-02-09 ENCOUNTER — Other Ambulatory Visit (INDEPENDENT_AMBULATORY_CARE_PROVIDER_SITE_OTHER): Payer: Self-pay | Admitting: Physician Assistant

## 2018-02-09 NOTE — Telephone Encounter (Signed)
Ok to refill 

## 2018-03-02 DIAGNOSIS — M2041 Other hammer toe(s) (acquired), right foot: Secondary | ICD-10-CM | POA: Diagnosis not present

## 2018-03-02 DIAGNOSIS — L97512 Non-pressure chronic ulcer of other part of right foot with fat layer exposed: Secondary | ICD-10-CM | POA: Diagnosis not present

## 2018-03-02 DIAGNOSIS — M79674 Pain in right toe(s): Secondary | ICD-10-CM | POA: Diagnosis not present

## 2018-03-02 DIAGNOSIS — L97312 Non-pressure chronic ulcer of right ankle with fat layer exposed: Secondary | ICD-10-CM | POA: Diagnosis not present

## 2018-03-02 DIAGNOSIS — M25571 Pain in right ankle and joints of right foot: Secondary | ICD-10-CM | POA: Diagnosis not present

## 2018-03-10 ENCOUNTER — Other Ambulatory Visit (INDEPENDENT_AMBULATORY_CARE_PROVIDER_SITE_OTHER): Payer: Self-pay | Admitting: Physician Assistant

## 2018-03-10 NOTE — Telephone Encounter (Signed)
Ok to refill 

## 2018-03-12 DIAGNOSIS — L97312 Non-pressure chronic ulcer of right ankle with fat layer exposed: Secondary | ICD-10-CM | POA: Diagnosis not present

## 2018-03-12 DIAGNOSIS — L02415 Cutaneous abscess of right lower limb: Secondary | ICD-10-CM | POA: Diagnosis not present

## 2018-03-15 DIAGNOSIS — Z95810 Presence of automatic (implantable) cardiac defibrillator: Secondary | ICD-10-CM | POA: Diagnosis not present

## 2018-03-15 DIAGNOSIS — I509 Heart failure, unspecified: Secondary | ICD-10-CM | POA: Diagnosis not present

## 2018-03-29 DIAGNOSIS — L97312 Non-pressure chronic ulcer of right ankle with fat layer exposed: Secondary | ICD-10-CM | POA: Diagnosis not present

## 2018-04-12 DIAGNOSIS — I255 Ischemic cardiomyopathy: Secondary | ICD-10-CM | POA: Diagnosis not present

## 2018-04-12 DIAGNOSIS — Z4502 Encounter for adjustment and management of automatic implantable cardiac defibrillator: Secondary | ICD-10-CM | POA: Diagnosis not present

## 2018-04-12 DIAGNOSIS — I480 Paroxysmal atrial fibrillation: Secondary | ICD-10-CM | POA: Diagnosis not present

## 2018-04-14 DIAGNOSIS — I480 Paroxysmal atrial fibrillation: Secondary | ICD-10-CM | POA: Diagnosis not present

## 2018-04-14 DIAGNOSIS — I739 Peripheral vascular disease, unspecified: Secondary | ICD-10-CM | POA: Diagnosis not present

## 2018-04-14 DIAGNOSIS — I10 Essential (primary) hypertension: Secondary | ICD-10-CM | POA: Diagnosis not present

## 2018-04-14 DIAGNOSIS — M159 Polyosteoarthritis, unspecified: Secondary | ICD-10-CM | POA: Diagnosis not present

## 2018-04-15 DIAGNOSIS — D3132 Benign neoplasm of left choroid: Secondary | ICD-10-CM | POA: Diagnosis not present

## 2018-04-15 DIAGNOSIS — H2513 Age-related nuclear cataract, bilateral: Secondary | ICD-10-CM | POA: Diagnosis not present

## 2018-04-15 DIAGNOSIS — H35363 Drusen (degenerative) of macula, bilateral: Secondary | ICD-10-CM | POA: Diagnosis not present

## 2018-04-21 DIAGNOSIS — M86471 Chronic osteomyelitis with draining sinus, right ankle and foot: Secondary | ICD-10-CM | POA: Diagnosis not present

## 2018-04-27 DIAGNOSIS — Z1211 Encounter for screening for malignant neoplasm of colon: Secondary | ICD-10-CM | POA: Diagnosis not present

## 2018-04-27 DIAGNOSIS — I509 Heart failure, unspecified: Secondary | ICD-10-CM | POA: Diagnosis not present

## 2018-04-27 DIAGNOSIS — L97312 Non-pressure chronic ulcer of right ankle with fat layer exposed: Secondary | ICD-10-CM | POA: Diagnosis not present

## 2018-04-27 DIAGNOSIS — Z7901 Long term (current) use of anticoagulants: Secondary | ICD-10-CM | POA: Diagnosis not present

## 2018-04-27 DIAGNOSIS — Z8679 Personal history of other diseases of the circulatory system: Secondary | ICD-10-CM | POA: Diagnosis not present

## 2018-04-30 ENCOUNTER — Ambulatory Visit (INDEPENDENT_AMBULATORY_CARE_PROVIDER_SITE_OTHER): Payer: Medicare Other | Admitting: Nurse Practitioner

## 2018-04-30 ENCOUNTER — Telehealth: Payer: Self-pay | Admitting: Cardiology

## 2018-04-30 DIAGNOSIS — I5022 Chronic systolic (congestive) heart failure: Secondary | ICD-10-CM | POA: Diagnosis not present

## 2018-04-30 LAB — CUP PACEART INCLINIC DEVICE CHECK
Date Time Interrogation Session: 20200131125948
Implantable Lead Implant Date: 20190327
Implantable Lead Implant Date: 20190327
Implantable Lead Implant Date: 20190327
Implantable Lead Implant Date: 20190327
Implantable Lead Location: 753860
Implantable Lead Location: 753860
Implantable Pulse Generator Implant Date: 20190327
Implantable Pulse Generator Implant Date: 20190327
MDC IDC LEAD LOCATION: 753859
MDC IDC LEAD LOCATION: 753859
MDC IDC SESS DTM: 20200131101712

## 2018-04-30 NOTE — Telephone Encounter (Signed)
Spoke w/ pt and informed him that Chanetta Marshall, NP wanted to see him today to check his device. He agreed to a 9:40 appt.

## 2018-04-30 NOTE — Telephone Encounter (Signed)
Patient called and stated that he heard an alert tone from his device this morning around 7:00 AM. He stated that it was a serious of beeps. He hasn't heard it any more. He is not near his monitor and will not be back near his monitor until late Saturday. Informed pt that I would call him back once I spoke w/ Chanetta Marshall, NP.

## 2018-04-30 NOTE — Progress Notes (Signed)
Pt was in town visiting his daughter. Device with audibile alert this morning. His home monitor is at his home in [place] (he has transferred care there). Device interrogation shows alert for AF>6 hours. He is anticoagulated with Xarelto and asymptomatic. Presenting rhythm SR. I have turned AF alert off and only left on V rate >120 for audible alert. He and his cardiologist in [place] have discussed treatment options for AF. No other changes  Chanetta Marshall, NP 04/30/2018 10:10 AM

## 2018-05-07 ENCOUNTER — Other Ambulatory Visit (INDEPENDENT_AMBULATORY_CARE_PROVIDER_SITE_OTHER): Payer: Self-pay | Admitting: Physician Assistant

## 2018-05-18 DIAGNOSIS — M86471 Chronic osteomyelitis with draining sinus, right ankle and foot: Secondary | ICD-10-CM | POA: Diagnosis not present

## 2018-05-18 DIAGNOSIS — Z7901 Long term (current) use of anticoagulants: Secondary | ICD-10-CM | POA: Diagnosis not present

## 2018-05-18 DIAGNOSIS — I4891 Unspecified atrial fibrillation: Secondary | ICD-10-CM | POA: Diagnosis not present

## 2018-05-18 DIAGNOSIS — M86661 Other chronic osteomyelitis, right tibia and fibula: Secondary | ICD-10-CM | POA: Diagnosis not present

## 2018-05-18 DIAGNOSIS — Z95810 Presence of automatic (implantable) cardiac defibrillator: Secondary | ICD-10-CM | POA: Diagnosis not present

## 2018-05-18 DIAGNOSIS — Z87891 Personal history of nicotine dependence: Secondary | ICD-10-CM | POA: Diagnosis not present

## 2018-05-31 DIAGNOSIS — I110 Hypertensive heart disease with heart failure: Secondary | ICD-10-CM | POA: Diagnosis not present

## 2018-05-31 DIAGNOSIS — I10 Essential (primary) hypertension: Secondary | ICD-10-CM | POA: Diagnosis not present

## 2018-05-31 DIAGNOSIS — M1990 Unspecified osteoarthritis, unspecified site: Secondary | ICD-10-CM | POA: Diagnosis not present

## 2018-05-31 DIAGNOSIS — Z1211 Encounter for screening for malignant neoplasm of colon: Secondary | ICD-10-CM | POA: Diagnosis not present

## 2018-05-31 DIAGNOSIS — I4891 Unspecified atrial fibrillation: Secondary | ICD-10-CM | POA: Diagnosis not present

## 2018-05-31 DIAGNOSIS — I509 Heart failure, unspecified: Secondary | ICD-10-CM | POA: Diagnosis not present

## 2018-06-10 DIAGNOSIS — M25512 Pain in left shoulder: Secondary | ICD-10-CM | POA: Diagnosis not present

## 2018-06-10 DIAGNOSIS — M542 Cervicalgia: Secondary | ICD-10-CM | POA: Diagnosis not present

## 2018-06-10 DIAGNOSIS — M5033 Other cervical disc degeneration, cervicothoracic region: Secondary | ICD-10-CM | POA: Diagnosis not present

## 2018-06-22 DIAGNOSIS — M542 Cervicalgia: Secondary | ICD-10-CM | POA: Diagnosis not present

## 2018-06-29 DIAGNOSIS — Z4502 Encounter for adjustment and management of automatic implantable cardiac defibrillator: Secondary | ICD-10-CM | POA: Diagnosis not present

## 2018-06-29 DIAGNOSIS — I420 Dilated cardiomyopathy: Secondary | ICD-10-CM | POA: Diagnosis not present

## 2018-06-29 DIAGNOSIS — I480 Paroxysmal atrial fibrillation: Secondary | ICD-10-CM | POA: Diagnosis not present

## 2018-06-29 DIAGNOSIS — M542 Cervicalgia: Secondary | ICD-10-CM | POA: Diagnosis not present

## 2018-07-01 DIAGNOSIS — M542 Cervicalgia: Secondary | ICD-10-CM | POA: Diagnosis not present

## 2018-07-06 DIAGNOSIS — M542 Cervicalgia: Secondary | ICD-10-CM | POA: Diagnosis not present

## 2018-07-09 DIAGNOSIS — M542 Cervicalgia: Secondary | ICD-10-CM | POA: Diagnosis not present

## 2018-07-14 DIAGNOSIS — M542 Cervicalgia: Secondary | ICD-10-CM | POA: Diagnosis not present

## 2019-05-13 ENCOUNTER — Telehealth: Payer: Self-pay | Admitting: Cardiovascular Disease

## 2019-05-13 NOTE — Telephone Encounter (Signed)
Samantha from [facility] is calling requesting the patients Open heart report. Please advise.
